# Patient Record
Sex: Male | Born: 1990
Health system: Southern US, Community
[De-identification: ages and names within clinical notes are randomized; demographics above are authoritative.]

## PROBLEM LIST (undated history)

## (undated) DIAGNOSIS — F1910 Other psychoactive substance abuse, uncomplicated: Secondary | ICD-10-CM

## (undated) DIAGNOSIS — J45909 Unspecified asthma, uncomplicated: Secondary | ICD-10-CM

## (undated) DIAGNOSIS — F419 Anxiety disorder, unspecified: Secondary | ICD-10-CM

## (undated) DIAGNOSIS — F909 Attention-deficit hyperactivity disorder, unspecified type: Secondary | ICD-10-CM

## (undated) DIAGNOSIS — A539 Syphilis, unspecified: Secondary | ICD-10-CM

## (undated) HISTORY — DX: Anxiety disorder, unspecified: F41.9

## (undated) HISTORY — DX: Syphilis, unspecified: A53.9

---
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
## 2016-04-09 ENCOUNTER — Ambulatory Visit (INDEPENDENT_AMBULATORY_CARE_PROVIDER_SITE_OTHER): Payer: 59 | Admitting: Psychology

## 2016-04-09 DIAGNOSIS — F902 Attention-deficit hyperactivity disorder, combined type: Secondary | ICD-10-CM

## 2016-04-09 DIAGNOSIS — F840 Autistic disorder: Secondary | ICD-10-CM

## 2016-05-06 ENCOUNTER — Other Ambulatory Visit: Payer: Self-pay | Admitting: Allergy and Immunology

## 2016-05-21 ENCOUNTER — Ambulatory Visit (INDEPENDENT_AMBULATORY_CARE_PROVIDER_SITE_OTHER): Payer: 59 | Admitting: Psychology

## 2016-05-21 DIAGNOSIS — F900 Attention-deficit hyperactivity disorder, predominantly inattentive type: Secondary | ICD-10-CM | POA: Diagnosis not present

## 2016-05-21 DIAGNOSIS — F840 Autistic disorder: Secondary | ICD-10-CM

## 2016-05-28 ENCOUNTER — Ambulatory Visit (INDEPENDENT_AMBULATORY_CARE_PROVIDER_SITE_OTHER): Payer: 59 | Admitting: Psychology

## 2016-05-28 DIAGNOSIS — F341 Dysthymic disorder: Secondary | ICD-10-CM

## 2016-05-28 DIAGNOSIS — F840 Autistic disorder: Secondary | ICD-10-CM

## 2016-05-28 DIAGNOSIS — F902 Attention-deficit hyperactivity disorder, combined type: Secondary | ICD-10-CM

## 2016-05-29 ENCOUNTER — Encounter: Payer: Self-pay | Admitting: *Deleted

## 2016-05-29 ENCOUNTER — Other Ambulatory Visit: Payer: Self-pay | Admitting: *Deleted

## 2016-05-29 NOTE — Progress Notes (Signed)
This encounter was created in error - please disregard.

## 2016-05-30 ENCOUNTER — Other Ambulatory Visit: Payer: Self-pay | Admitting: *Deleted

## 2016-05-30 NOTE — Telephone Encounter (Signed)
Refill request received from [facility] [phone] faxed back to pharmacy needs office visit last ov 02/09/2014

## 2016-06-06 ENCOUNTER — Ambulatory Visit (INDEPENDENT_AMBULATORY_CARE_PROVIDER_SITE_OTHER): Payer: 59 | Admitting: Psychology

## 2016-06-06 DIAGNOSIS — F902 Attention-deficit hyperactivity disorder, combined type: Secondary | ICD-10-CM | POA: Diagnosis not present

## 2016-06-06 DIAGNOSIS — F840 Autistic disorder: Secondary | ICD-10-CM | POA: Diagnosis not present

## 2016-06-20 ENCOUNTER — Ambulatory Visit (INDEPENDENT_AMBULATORY_CARE_PROVIDER_SITE_OTHER): Payer: 59 | Admitting: Psychology

## 2016-06-20 DIAGNOSIS — F902 Attention-deficit hyperactivity disorder, combined type: Secondary | ICD-10-CM | POA: Diagnosis not present

## 2016-06-20 DIAGNOSIS — F840 Autistic disorder: Secondary | ICD-10-CM | POA: Diagnosis not present

## 2016-07-04 ENCOUNTER — Ambulatory Visit (INDEPENDENT_AMBULATORY_CARE_PROVIDER_SITE_OTHER): Payer: 59 | Admitting: Psychology

## 2016-07-04 DIAGNOSIS — F840 Autistic disorder: Secondary | ICD-10-CM

## 2016-07-04 DIAGNOSIS — F902 Attention-deficit hyperactivity disorder, combined type: Secondary | ICD-10-CM | POA: Diagnosis not present

## 2016-07-24 ENCOUNTER — Ambulatory Visit (INDEPENDENT_AMBULATORY_CARE_PROVIDER_SITE_OTHER): Payer: 59 | Admitting: Psychology

## 2016-07-24 DIAGNOSIS — F840 Autistic disorder: Secondary | ICD-10-CM

## 2016-07-24 DIAGNOSIS — F902 Attention-deficit hyperactivity disorder, combined type: Secondary | ICD-10-CM

## 2016-08-15 ENCOUNTER — Ambulatory Visit (INDEPENDENT_AMBULATORY_CARE_PROVIDER_SITE_OTHER): Payer: 59 | Admitting: Psychology

## 2016-08-15 DIAGNOSIS — F1020 Alcohol dependence, uncomplicated: Secondary | ICD-10-CM | POA: Diagnosis not present

## 2016-08-15 DIAGNOSIS — F332 Major depressive disorder, recurrent severe without psychotic features: Secondary | ICD-10-CM | POA: Diagnosis not present

## 2016-08-15 DIAGNOSIS — F840 Autistic disorder: Secondary | ICD-10-CM

## 2016-08-20 ENCOUNTER — Other Ambulatory Visit (HOSPITAL_COMMUNITY): Payer: 59 | Attending: Psychiatry | Admitting: Licensed Clinical Social Worker

## 2016-08-20 ENCOUNTER — Encounter (INDEPENDENT_AMBULATORY_CARE_PROVIDER_SITE_OTHER): Payer: Self-pay

## 2016-08-20 DIAGNOSIS — F19982 Other psychoactive substance use, unspecified with psychoactive substance-induced sleep disorder: Secondary | ICD-10-CM | POA: Diagnosis not present

## 2016-08-20 DIAGNOSIS — G479 Sleep disorder, unspecified: Secondary | ICD-10-CM | POA: Diagnosis not present

## 2016-08-20 DIAGNOSIS — F1523 Other stimulant dependence with withdrawal: Secondary | ICD-10-CM | POA: Insufficient documentation

## 2016-08-20 DIAGNOSIS — F840 Autistic disorder: Secondary | ICD-10-CM | POA: Diagnosis not present

## 2016-08-20 DIAGNOSIS — F329 Major depressive disorder, single episode, unspecified: Secondary | ICD-10-CM | POA: Diagnosis not present

## 2016-08-20 DIAGNOSIS — F17210 Nicotine dependence, cigarettes, uncomplicated: Secondary | ICD-10-CM | POA: Diagnosis not present

## 2016-08-20 DIAGNOSIS — F1510 Other stimulant abuse, uncomplicated: Secondary | ICD-10-CM | POA: Diagnosis not present

## 2016-08-20 DIAGNOSIS — F1410 Cocaine abuse, uncomplicated: Secondary | ICD-10-CM | POA: Diagnosis present

## 2016-08-20 DIAGNOSIS — F1020 Alcohol dependence, uncomplicated: Secondary | ICD-10-CM | POA: Diagnosis present

## 2016-08-20 DIAGNOSIS — F1210 Cannabis abuse, uncomplicated: Secondary | ICD-10-CM | POA: Insufficient documentation

## 2016-08-20 DIAGNOSIS — F439 Reaction to severe stress, unspecified: Secondary | ICD-10-CM | POA: Insufficient documentation

## 2016-08-20 DIAGNOSIS — F902 Attention-deficit hyperactivity disorder, combined type: Secondary | ICD-10-CM | POA: Insufficient documentation

## 2016-08-20 DIAGNOSIS — F341 Dysthymic disorder: Secondary | ICD-10-CM | POA: Diagnosis not present

## 2016-08-20 DIAGNOSIS — G4700 Insomnia, unspecified: Secondary | ICD-10-CM | POA: Insufficient documentation

## 2016-08-20 NOTE — Psych (Signed)
Comprehensive Clinical Assessment (CCA) Note  08/20/2016 Edward Griffith [id number]  Visit Diagnosis:      ICD-9-CM ICD-10-CM   1. Alcohol use disorder, severe, dependence (HCC) 303.90 F10.20   2. Cocaine abuse 305.60 F14.10   3. Stimulant abuse 305.90 F15.10       CCA Part One  Part One has been completed on paper by the patient.  (See scanned document in Chart Review)  CCA Part Two A  Intake/Chief Complaint:  CCA Intake With Chief Complaint CCA Part Two Date: 08/20/16 CCA Part Two Time: 1414 Chief Complaint/Presenting Problem: Pt presents with his mother requesting treatment for substance use. Pt reports he has been drinking heavily as well as snorting cocaine and abusing his Adderall prescription. Pt states he has not had alcohol or drugs in 3 days. Pt reports compounding depression and it is unclear if the depression symptoms began before or after his use began. Pt reports fleeting SI and denies plan or intent.  Patients Currently Reported Symptoms/Problems: Pt reports experiencing withdrawal symptoms of nausea, fatigue, tremors, sweating, and hyper-somnia. Pt is not visibly affected. Pt's vitals were taken by CMA and were within normal range. Pt reports feelings of shame and disappointment due to lying and stealing from his support system due to his use. Pt reports depressed mood, lethargy, and anhedonia.  Collateral Involvement: Pt's mother, Arline AspCindy, reports pt has history of lying and stealing. Pt's mother states pt has lied about the last 3-4 jobs he has had; stating he had jobs, fabricating stories from the day - and then having never worked there. Pt's mother states he has stolen checks from her multiple times. Pt's mother states pt was previously diagnosed on the autism spectrum.  Individual's Strengths: Pt reports desire to stop using and has some awareness.   Mental Health Symptoms Depression:  Depression: Worthlessness  Mania:     Anxiety:      Psychosis:     Trauma:      Obsessions:     Compulsions:     Inattention:     Hyperactivity/Impulsivity:     Oppositional/Defiant Behaviors:     Borderline Personality:     Other Mood/Personality Symptoms:      Mental Status Exam Appearance and self-care  Stature:  Stature: Average  Weight:  Weight: Average weight  Clothing:  Clothing: Casual  Grooming:  Grooming: Well-groomed  Cosmetic use:  Cosmetic Use: None  Posture/gait:  Posture/Gait: Normal  Motor activity:  Motor Activity: Not Remarkable  Sensorium  Attention:  Attention: Normal  Concentration:  Concentration: Normal  Orientation:  Orientation: X5  Recall/memory:  Recall/Memory: Normal  Affect and Mood  Affect:  Affect: Appropriate  Mood:  Mood: Depressed  Relating  Eye contact:  Eye Contact: Fleeting  Facial expression:  Facial Expression: Responsive  Attitude toward examiner:  Attitude Toward Examiner: Cooperative  Thought and Language  Speech flow: Speech Flow: Normal  Thought content:  Thought Content: Appropriate to mood and circumstances  Preoccupation:     Hallucinations:     Organization:     Company secretaryxecutive Functions  Fund of Knowledge:  Fund of Knowledge: Average  Intelligence:  Intelligence: Average  Abstraction:  Abstraction: Normal  Judgement:  Judgement: Fair  Dance movement psychotherapisteality Testing:  Reality Testing: Adequate  Insight:  Insight: Flashes of insight  Decision Making:  Decision Making: Impulsive  Social Functioning  Social Maturity:     Social Judgement:     Stress  Stressors:  Stressors: Family conflict, Money, Transitions, Housing  Coping Ability:  Coping Ability: Exhausted  Skill Deficits:     Supports:      Family and Psychosocial History: Family history Marital status: Single  Childhood History:  Childhood History By whom was/is the patient raised?: Both parents Patient's description of current relationship with people who raised him/her: Pt reports conflictual relationship with his parents however states this is mostly  due to his use.  Does patient have siblings?: Yes Did patient suffer any verbal/emotional/physical/sexual abuse as a child?: No Did patient suffer from severe childhood neglect?: No Has patient ever been sexually abused/assaulted/raped as an adolescent or adult?: No Was the patient ever a victim of a crime or a disaster?: No Witnessed domestic violence?: No Has patient been effected by domestic violence as an adult?: No  CCA Part Two B  Employment/Work Situation: Employment / Work Psychologist, occupational Employment situation: Unemployed Has patient ever been in the [employer]?: No Are There Guns or Education officer, community in Your Home?: No  Education: Education Did Garment/textile technologist From [employer]?: Yes Did Theme park manager?: No  Religion: Religion/Spirituality Are You A Religious Person?: No  Leisure/Recreation: Leisure / Recreation Leisure and Hobbies: Pt reports "movies and videogames"  Exercise/Diet: Exercise/Diet Do You Have Any Trouble Sleeping?: Yes Explanation of Sleeping Difficulties: Pt states he has restless sleep  CCA Part Two C  Alcohol/Drug Use: Alcohol / Drug Use Pain Medications: Pt denies Prescriptions: Aderall XR 30 mg, PRN Over the Counter: Pt denies History of alcohol / drug use?: Yes Negative Consequences of Use: Financial, Personal relationships, Work / Programmer, multimedia Withdrawal Symptoms: Sweats, Irritability, Tremors, Nausea / Vomiting Substance #1 Name of Substance 1: Alcohol 1 - Age of First Use: UKN 1 - Amount (size/oz): 3-4, 40 oz drinks daily 1 - Frequency: daily; increase on weekends 1 - Duration: 4 years 1 - Last Use / Amount: 08/17/16; pt reports he does not remember how much Substance #2 Name of Substance 2: Cocaine 2 - Age of First Use: UKN 2 - Amount (size/oz): "a few lines" 2 - Frequency: on the weekends 2 - Duration: 1.5 - 2 years 2 - Last Use / Amount: 08/16/16 Substance #3 Name of Substance 3: Adderall (Pt reports absuing his prescription) 3 - Age of  First Use: UKN 3 - Amount (size/oz): 90-120 mg 3 - Frequency: 2-3x/week 3 - Duration: UKN 3 - Last Use / Amount: 08/15/16     CCA Part Three  ASAM's:  Six Dimensions of Multidimensional Assessment  Dimension 1:  Acute Intoxication and/or Withdrawal Potential:     Dimension 2:  Biomedical Conditions and Complications:     Dimension 3:  Emotional, Behavioral, or Cognitive Conditions and Complications:     Dimension 4:  Readiness to Change:     Dimension 5:  Relapse, Continued use, or Continued Problem Potential:     Dimension 6:  Recovery/Living Environment:      Substance use Disorder (SUD) Substance Use Disorder (SUD)  Checklist Symptoms of Substance Use: Evidence of tolerance, Continued use despite persistent or recurrent social, interpersonal problems, caused or exacerbated by use, Evidence of withdrawal (Comment), Large amounts of time spent to obtain, use or recover from the substance(s), Presence of craving or strong urge to use, Social, occupational, recreational activities given up or reduced due to use, Recurrent use that results in a fialure to fulfill major rule obligatinos (work, school, home)  Social Function:     Stress:  Stress Stressors: Family conflict, Arts administrator, Transitions, Housing Coping Ability: Exhausted Patient Takes Medications The Way The Doctor Instructed?:  No Priority Risk: Moderate Risk  Risk Assessment- Self-Harm Potential: Risk Assessment For Self-Harm Potential Thoughts of Self-Harm: No current thoughts Method: No plan  Risk Assessment -Dangerous to Others Potential: Risk Assessment For Dangerous to Others Potential Method: No Plan  DSM5 Diagnoses: There are no active problems to display for this patient.   Recommendations for Services/Supports/Treatments: Recommendations for Services/Supports/Treatments Recommendations For Services/Supports/Treatments: CD-IOP Intensive Chemical Dependency Program (Pt will benefit from CD-IOP due to extensive  use history and recent ceasing of use.  Pt will need support during early recovery)  Treatment Plan Summary: OP Treatment Plan Summary: Pt states: "I need get off this stuff. I don't need it in my life."   Referrals to Alternative Service(s): Referred to Alternative Service(s):   Place:   Date:   Time:    Referred to Alternative Service(s):   Place:   Date:   Time:    Referred to Alternative Service(s):   Place:   Date:   Time:    Referred to Alternative Service(s):   Place:   Date:   Time:     Donia GuilesJenny Imaya Duffy, MSW, LCSW, LCAS

## 2016-08-25 ENCOUNTER — Other Ambulatory Visit (HOSPITAL_COMMUNITY): Payer: 59 | Admitting: Psychology

## 2016-08-25 DIAGNOSIS — F1020 Alcohol dependence, uncomplicated: Secondary | ICD-10-CM | POA: Diagnosis not present

## 2016-08-26 ENCOUNTER — Other Ambulatory Visit (HOSPITAL_COMMUNITY): Payer: 59

## 2016-08-27 ENCOUNTER — Other Ambulatory Visit (HOSPITAL_COMMUNITY): Payer: 59 | Admitting: Psychology

## 2016-08-27 ENCOUNTER — Encounter (HOSPITAL_COMMUNITY): Payer: Self-pay | Admitting: Psychology

## 2016-08-27 DIAGNOSIS — F1020 Alcohol dependence, uncomplicated: Secondary | ICD-10-CM

## 2016-08-27 NOTE — Progress Notes (Signed)
    Daily Group Progress Note  Program: CD-IOP   08/27/2016 Edward Griffith [id number]  Diagnosis:  No diagnosis found.   Sobriety Date: 11/17  Group Time: 1-23:30 pm  Participation Level: Active  Behavioral Response: Sharing  Type of Therapy: Process Group  Interventions: Supportive  Topic: Process: Counselors met with patients to discuss recovery-related activities they had engaged in to support their sobriety. Two group members shared that they had returned to use. A new patient introduced himself to the group and shared reasons for pursuing treatment. All members were active and engaged in the discussion concerning recovery from mind-altering drugs and alcohol.  Group Time: 2:30-4 pm  Participation Level: Minimal  Behavioral Response: Sharing  Type of Therapy: Psycho-education Group  Interventions: Solution Focused  Topic: Psychoeducation: Counselors facilitated a discussion on relapse-prevention techniques. Group members filled out and discussed a checklist of situations they had encountered during their recovery. Information was provided regarding 12-step meetings during the holiday season. The counselors asked patients to share holiday plans as they related to recovery. A new group member met with the program director.   Summary: The patient presented as moderately active and engaged in group. This was the patient's first group. He shared that he had been "drinking, lying, and stealing" and that it had hurt his relationship with others around him - particularly his parents. He shared that he was unsure of the events from the previous week and therefore was unable to directly pinpoint when he last used. He admitted that his parents are the driving force behind him being here. He seemed somewhat 'out of it' and his participation in the psycho-ed was minimal.   UDS collected: No Results:   AA/NA attended?: No  Sponsor?: No   Ann Evans, LCAS 08/27/2016 6:19 PM 

## 2016-08-29 ENCOUNTER — Other Ambulatory Visit (HOSPITAL_COMMUNITY): Payer: 59

## 2016-08-31 ENCOUNTER — Encounter (HOSPITAL_COMMUNITY): Payer: Self-pay | Admitting: Psychology

## 2016-08-31 NOTE — Progress Notes (Signed)
    Daily Group Progress Note  Program: CD-IOP    ADAMS HINCH [id number]  Diagnosis: F10.20  Sobriety Date: 11/17  Group Time: 1-2:30 pm  Participation Level: Minimal  Behavioral Response: Resistant and inattentive  Type of Therapy: Process Group  Interventions: Supportive  Topic: Process: The first half of group was spent in process. Group members described the things they had done since we last met to support their sobriety. They were also encouraged to share any thoughts of using, cravings or challenges to their recovery. The program director met with 2 group members and drug tests were collected from every member except for the member who was graduating today. During their check-ins, members shared briefly about their plans over the upcoming holiday weekend.   Group Time: 2:30-4 pm  Participation Level: Minimal  Behavioral Response: disengaged  Type of Therapy: Psycho-education Group  Interventions: Strength-based  Topic: Psycho-Ed: Relapse Prevention, Part 2/Graduation: the second half of group was spent in a psycho-ed on relapse prevention. It was a continuation of the psycho-ed on Monday. Members reviewed the handout provided during the last session and a discussion ensued on challenging thoughts of using in early recovery. Members shared openly about some of their ambivalence, which is to be expected. During the last 20 minutes of group today a graduation ceremony was held to honor a member who was completing the program. His wife had arrived and joined the celebration as did a member who had graduated last week. Kind words and brownies were shared as the session ended.   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 He explained that this is what he does for income. He reported he had gotten a phone call this morning stating that his ex-fiance was in ICU at [facility]  and unresponsive. The patient wasn't sure what had happened other than she had gone with friends out to party last night. This is all he knew. When asked about meetings, the patient reported that he hadn't gone to any yet. He reminded the group that he doesn't drive, "I have a license but not a car" and his aunt is staying at the house and transporting him around. His parents have gone on a 2 week vacation and got his aunt to chaperone their son for the time being. When asked for feedback about another member's disclosure, the patient shook his head and admitted he wasn't paying attention. He had said pretty much the same thing in the last group session and it seems obvious this young man is not very attentive or even interested in what we are discussing here in the sessions. This counselor reminded the patient that attending 12-step meetings was required and he would have to report on this when we meet again on Monday.    UDS collected: Yes Results: pending  AA/NA attended?: No  Sponsor?: No   Brandon Melnick, LCAS 08/31/2016 11:14 AM

## 2016-09-01 ENCOUNTER — Other Ambulatory Visit (HOSPITAL_COMMUNITY): Payer: 59 | Admitting: Psychology

## 2016-09-01 DIAGNOSIS — F1020 Alcohol dependence, uncomplicated: Secondary | ICD-10-CM

## 2016-09-02 ENCOUNTER — Other Ambulatory Visit (HOSPITAL_COMMUNITY): Payer: 59

## 2016-09-02 NOTE — Progress Notes (Signed)
    Daily Group Progress Note  Program: CD-IOP   09/02/2016 Edward Griffith [id number]  Diagnosis:  Alcohol use disorder, severe, dependence ([place])   Sobriety Date: 08/22/16  Group Time: 1-2:30  Participation Level: Active  Behavioral Response: Appropriate, Sharing and Minimizing  Type of Therapy: Process Group  Interventions: CBT, Motivational Interviewing and Solution Focused  Topic: Counselors met with patients for 1.5 hour group processing session. Patients were active and engaged and discussed the recent holiday break, challenges to their recovery process, and family interactions. UDS were collected from all patients present. 2 group members shared about lapsing over the break. One patient was absent with no known attempt to communicate. Another group member was absent and called to say she was sick at home. Some patients met with program director to discuss MAT.     Group Time: 2:30-4  Participation Level: Active  Behavioral Response: Appropriate and Sharing  Type of Therapy: Psycho-education Group  Interventions: CBT, Motivational Interviewing and Solution Focused  Topic: Counselors met with patients for 1.5 hour group psychoeducation session. Counselor led topical discussion on "Adult Children of Alcoholics". Patients identified characteristics of healthy and unhealthy families. Counselor also led a 5 minute guided imagery meditation about "Coping with Change". Patient processed their response to activity and meditation.   Summary: Patient was moderately active and engaged in group session. He presented as distracted and minimizing of his problematic behavior. Patient stated that he attended 2 AA meetings since our last group but was unable to answer the location of one of the meetings. Patient shared that he spent the holiday with his brother and ended up sleeping around 20 hours. Patient stated this was not normal for him. Patient shared about his connection to a  local coffee chop that was "very supportive" of his mental health in previous attempts to "live clean". Patient reported he went to a bar over the break to play pool and resisted the temptation to drink alcohol. Counselor discussed alternatives for playing pool instead of at a bar. Patient appears ambivalent about his addiction to stimulants but serious about changing his drinking behavior. A UDS was collected from patient. Youlanda Roys, LPCA   UDS collected: Yes Results: Pending  AA/NA attended?: YesWednesday and Friday  Sponsor?: No   Brandon Melnick, LCAS 09/02/2016 3:26 PM

## 2016-09-03 ENCOUNTER — Other Ambulatory Visit (INDEPENDENT_AMBULATORY_CARE_PROVIDER_SITE_OTHER): Payer: 59 | Admitting: Psychology

## 2016-09-03 ENCOUNTER — Encounter (HOSPITAL_COMMUNITY): Payer: Self-pay | Admitting: Medical

## 2016-09-03 VITALS — BP 118/72 | HR 72 | Ht 69.0 in | Wt 143.0 lb

## 2016-09-03 DIAGNOSIS — Z811 Family history of alcohol abuse and dependence: Secondary | ICD-10-CM

## 2016-09-03 DIAGNOSIS — F902 Attention-deficit hyperactivity disorder, combined type: Secondary | ICD-10-CM

## 2016-09-03 DIAGNOSIS — F840 Autistic disorder: Secondary | ICD-10-CM | POA: Diagnosis not present

## 2016-09-03 DIAGNOSIS — F1520 Other stimulant dependence, uncomplicated: Secondary | ICD-10-CM

## 2016-09-03 DIAGNOSIS — F1210 Cannabis abuse, uncomplicated: Secondary | ICD-10-CM | POA: Insufficient documentation

## 2016-09-03 DIAGNOSIS — F909 Attention-deficit hyperactivity disorder, unspecified type: Secondary | ICD-10-CM | POA: Insufficient documentation

## 2016-09-03 DIAGNOSIS — F1523 Other stimulant dependence with withdrawal: Secondary | ICD-10-CM | POA: Diagnosis not present

## 2016-09-03 DIAGNOSIS — F1020 Alcohol dependence, uncomplicated: Secondary | ICD-10-CM | POA: Diagnosis not present

## 2016-09-03 DIAGNOSIS — F1593 Other stimulant use, unspecified with withdrawal: Secondary | ICD-10-CM

## 2016-09-03 DIAGNOSIS — Z6372 Alcoholism and drug addiction in family: Secondary | ICD-10-CM

## 2016-09-03 DIAGNOSIS — F19982 Other psychoactive substance use, unspecified with psychoactive substance-induced sleep disorder: Secondary | ICD-10-CM

## 2016-09-03 DIAGNOSIS — Z8659 Personal history of other mental and behavioral disorders: Secondary | ICD-10-CM

## 2016-09-03 DIAGNOSIS — F341 Dysthymic disorder: Secondary | ICD-10-CM

## 2016-09-03 MED ORDER — BUPROPION HCL ER (SR) 150 MG PO TB12: 150.0000 mg | ORAL_TABLET | Freq: Two times a day (BID) | ORAL | 2 refills | Status: DC

## 2016-09-03 NOTE — Progress Notes (Addendum)
Psychiatric Initial Adult Assessment   Patient Identification: Edward Griffith MRN:  [id number] Date of Evaluation:  09/03/2016 Referral Source: Thomas HoffJennie Edminson LCSW Chief Complaint:   Chief Complaint    Addiction Problem; Establish Care; Autism; ADHD; Trauma; Stress; Sleep disorder     Visit Diagnosis:    ICD-9-CM ICD-10-CM   1. Stimulant dependence (HCC) 304.40 F15.20    Amphetamine and Cocaine  2. Alcohol use disorder, severe, dependence (HCC) 303.90 F10.20   3. Stimulant withdrawal (HCC) 292.0 F15.23    304.40    4. Autism spectrum disorder 299.00 F84.0   5. Attention deficit hyperactivity disorder (ADHD), combined type 314.01 F90.2   6. Cannabis abuse, episodic use 305.22 F12.10   7. History of posttraumatic stress disorder (PTSD) V11.8 Z86.59   8. Dysthymia (or depressive neurosis) 300.4 F34.1   9. Drug induced insomnia (HCC) 292.85 F19.982    E980.5     Subjective "I have an addiction to alcohol and coke (snort)" History of Present Illness:   Associated Signs/Symptoms: DSM V SUD Criteria 9/11 + Severe dependence Alcohol and Stimulants(Amphetamine and Cocaine) Depression Symptoms:  depressed mood, anhedonia, insomnia, fatigue, feelings of worthlessness/guilt, difficulty concentrating, (Hypo) Manic Symptoms:  Hallucinations,drug related Anxiety Symptoms:  Agoraphobia,No Excessive Worry,No Panic Symptoms,None recently/Drug related Obsessive Compulsive Symptoms:   None,, Social Anxiety,No Specific Phobias,Buses;Heights Psychotic Symptoms:  Hallucinations: Auditory Visual drug withdrawal related PTSD Symptoms: Had a traumatic exposure:  Psychiatric [facility] [place] Traumatic infancy/early childhood fostered/multiple hospitalizations for illness age 25 mos ?in Utero exsposure to drugs/alcohol -Had a traumatic exposure in the last month:  NONE Re-experiencing:  Hospital Flashbacks Nightmares Hypervigilance:   Yes Hyperarousal:  Difficulty Concentrating Emotional Numbness/Detachment Irritability/Anger Sleep Avoidance:  Decreased Interest/Participation Foreshortened Future [facility]  Past Psychiatric History: Diagnosis: ADHD age 14;ASD age 25;  Hospitalizations:[place] 2015 [facility] reaction "chemical imbalance"   [facility]  Substance Abuse Care: ED visit 11/10 /17 Alcohol abuse/[facility] CDIOP now  Self-Mutilation: NA  Suicidal Attempts: NONE  Violent Behaviors: NONE    Previous Psychotropic Medications: Yes Multiple amphetamines-Adderall mainly/Straterra-?psychotic reaction  Substance Abuse History in the last 12 months:  Yes.   Substance Abuse History in the last 12 months: Substance Age of 1st Use Last Use Amount Specific Type  Nicotine 15 today 1 PPD Cigs  Alcohol 15 08/17/2016  Reports drinking Approximately 4, 40 ounce beers during the weekdays, and will consume 5-6 shots and up to 12 beers on the weekends       Cannabis 15 08/15/2016 2-4x/week smoke  Opiates      Cocaine 19 08/15/2016 2-3x/wk snort  Methamphetamines 25 y/o ADHD 30 mg QD 08/15/2016 90-120mg  2-3x/wk Snort/mix with cocaine  LSD Tried "a couple of times didnt like    Ecstasy      Benzodiazepines      Caffeine      Inhalants      Others:                          Consequences of Substance Abuse: Withdrawal Symptoms:   Headaches dysphoria  Past Medical History: No past medical history on file. No past surgical history on file.  Family Psychiatric History:Birth parents Mother Schizophrenic Father Alcoholic   Family History: Diabetes runs in family  Social History:   Social History   Social History  . Marital status: Single    Spouse name: N/A  .  Number of children: N/A  . Years of education: Associates degree [employer]-credits-no degree   Social History Main Topics  . Smoking status: Current Every Day Smoker     Packs/day: 1.00    Types: Cigarettes age 25  . Smokeless tobacco: None  . Alcohol use on file  . Drug use: See SA chart  . Sexual activity: Bi   Other Topics Concern  . Not on file   Social History Narrative  . No narrative on file    Additional Social History:   Allergies:  Not on File  Metabolic Disorder Labs: No results found for: HGBA1C, MPG No results found for: PROLACTIN No results found for: CHOL, TRIG, HDL, CHOLHDL, VLDL, LDLCALC   Current Medications: Current Outpatient Prescriptions  Medication Sig Dispense Refill  . amphetamine-dextroamphetamine (ADDERALL XR) 30 MG 24 hr capsule     . amphetamine-dextroamphetamine (ADDERALL) 30 MG tablet     . EPINEPHrine 0.3 mg/0.3 mL IJ SOAJ injection      No current facility-administered medications for this visit.     Neurologic: Headache: Yes Seizure: Negative Paresthesias:Negative  Musculoskeletal: Strength & Muscle Tone: within normal limits Gait & Station: normal Patient leans: N/A  Psychiatric Specialty Exam: Review of Systems  Constitutional: Positive for malaise/fatigue and weight loss. Negative for chills, diaphoresis and fever.  HENT: Negative for congestion, ear discharge, ear pain, hearing loss, nosebleeds, sinus pain, sore throat and tinnitus.   Eyes: Negative for blurred vision, double vision, photophobia, pain, discharge and redness.  Respiratory: Negative for cough, hemoptysis, sputum production, shortness of breath, wheezing and stridor.   Cardiovascular: Negative for chest pain, palpitations, orthopnea, claudication, leg swelling and PND.  Gastrointestinal: Negative for abdominal pain, blood in stool, constipation, diarrhea, heartburn, nausea and vomiting.  Genitourinary: Negative for dysuria, flank pain, frequency, hematuria and urgency.  Musculoskeletal: Negative for back pain, falls, joint pain, myalgias and neck pain.  Skin: Negative for itching and rash.  Neurological: Positive for headaches  (withdrawal rekated). Negative for dizziness, tingling, tremors, sensory change, speech change, focal weakness, seizures, loss of consciousness and weakness.  Endo/Heme/Allergies: Negative for environmental allergies and polydipsia. Does not bruise/bleed easily.  Psychiatric/Behavioral: Positive for depression (Mild PHQ 9 score 9 not difficult) and hallucinations (drug related in past). Negative for memory loss, substance abuse and suicidal ideas. The patient has insomnia. The patient is not nervous/anxious (GAD 7 negative).        ASD/ADHD    Blood pressure 118/72, pulse 72, height 5\' 9"  (1.753 m), weight 143 lb (64.9 kg).Body mass index is 21.12 kg/m.  General Appearance: Fairly Groomed  Eye Contact:  Fair  Speech:  Clear and Coherent and Occasional edir torial comments after anwering questions in lower tone and more rapid less clear dicxtion  Volume:  Variable  Mood:  Variable  Affect:  Congruent and Full Range  Thought Process:  Coherent and Descriptions of Associations: Intact  Orientation:  Full (Time, Place, and Person)  Thought Content:  WDL, Logical and Rumination  Suicidal Thoughts:  No  Homicidal Thoughts:  No  Memory:  Negative  Judgement:  Impaired  Insight:  Lacking  Psychomotor Activity:  Normal  Concentration:  Concentration: Good and Attention Span: Good  Recall:  Good  Fund of Knowledge:Fair  Language: Fair  Akathisia:  NA  Handed:  Right  AIMS (if indicated):  NA  Assets:  Desire for Improvement Financial Resources/Insurance Housing Resilience Social Support Transportation Vocational/Educational  ADL's:  Intact  Cognition: Impaired,  Moderate  Sleep:  Improving  Treatment Plan Summary: Treatment Plan/Recommendations:  Plan of Care: [facility] CDIOP/PTSD IOP TraumaCounseling/ASD-ADHD Wellbutrin SR trial 150 mg BID,Classroom modifications   Laboratory:  UDS  Psychotherapy: IOP individual;group and family  Medications: Wellbutrin SR ?Baclofen for  cravings-pt denies at present  Routine PRN Medications:  Negative  Consultations: None now  Safety Concerns:  Relapse  Other:  ADHD     Maryjean Morn, PA-C 11/29/20174:53 PM

## 2016-09-04 ENCOUNTER — Other Ambulatory Visit (HOSPITAL_COMMUNITY): Payer: 59 | Admitting: Psychology

## 2016-09-04 DIAGNOSIS — F1020 Alcohol dependence, uncomplicated: Secondary | ICD-10-CM | POA: Diagnosis not present

## 2016-09-04 DIAGNOSIS — F1520 Other stimulant dependence, uncomplicated: Secondary | ICD-10-CM

## 2016-09-04 DIAGNOSIS — F902 Attention-deficit hyperactivity disorder, combined type: Secondary | ICD-10-CM

## 2016-09-04 DIAGNOSIS — F1523 Other stimulant dependence with withdrawal: Secondary | ICD-10-CM

## 2016-09-04 DIAGNOSIS — F840 Autistic disorder: Secondary | ICD-10-CM

## 2016-09-04 DIAGNOSIS — F1593 Other stimulant use, unspecified with withdrawal: Secondary | ICD-10-CM

## 2016-09-05 ENCOUNTER — Other Ambulatory Visit (HOSPITAL_COMMUNITY): Payer: 59

## 2016-09-05 NOTE — Progress Notes (Signed)
    Daily Group Progress Note  Program: CD-IOP   09/05/2016 Edward Griffith [id number]  Diagnosis:  Stimulant dependence ([place]) - Amphetamine and Cocaine  Alcohol use disorder, severe, dependence ([place])  Stimulant withdrawal ([place])  Autism spectrum disorder  Attention deficit hyperactivity disorder (ADHD), combined type  Cannabis abuse, episodic use  History of posttraumatic stress disorder (PTSD)  Dysthymia (or depressive neurosis)  Drug induced insomnia ([place])   Sobriety Date: 08/22/16  Group Time: 1-2:30  Participation Level: Active  Behavioral Response: Appropriate and Sharing  Type of Therapy: Process Group  Interventions: Motivational Interviewing  Topic: Counselors met with patients to discuss the activities they had engaged in to support their recovery since the previous session. A new group member introduced himself to the group and shared his reasons for pursuing treatment. Most members were active and engaged in the discussion concerning recovery from mind-altering drugs and alcohol.      Group Time: 2:30-4  Participation Level: Active  Behavioral Response: Appropriate and Sharing  Type of Therapy: Psycho-education Group  Interventions: Strength-based and Supportive  Topic:Counselors continued the discussion from the previous session about characteristics of Adult Children of Alcoholics. The group members finished reading though the laundry list and were asked to identify which characteristics they personally identified with. The counselors then helped the patients create personal measurable goals for their treatment based on the characteristic they identified. Two group members met with the Investment banker, operational. A drug test was collected from one group member. PHQ-9 and GAD assessments were collected from all group members.     Summary: The patient presented as active and engaged in group. He shared more than he had in previous sessions. 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 He shared that while he was growing up his parents fostered "over 70" kids which led to limited time and "no rules." He shared more of his dynamic with his parents and how it contributed to his use. He reports his use escalating following the death of his grandmother - he reports spending much of his time with her. Over the holiday he had been able to share information about his use to his brother. The patient met with the program director as part of the CD-IOP intake process. PHQ-9: 5 GAD: 2. The patient was unable to attend any meetings.  Youlanda Roys, LPCA   UDS collected: No Results: negative  AA/NA attended?: No  Sponsor?: No   Brandon Melnick, LCAS 09/05/2016 1:18 PM

## 2016-09-05 NOTE — Progress Notes (Signed)
    Daily Group Progress Note  Program: CD-IOP   09/05/2016 Edward Griffith [id number]  Diagnosis:  Stimulant dependence ([place])  Alcohol use disorder, severe, dependence ([place])  Stimulant withdrawal ([place])  Autism spectrum disorder  Attention deficit hyperactivity disorder (ADHD), combined type   Sobriety Date: 08/22/16  Group Time: 1-2:30    Participation Level: Minimal  Behavioral Response: Sharing  Type of Therapy: Process Group  Interventions: Motivational Interviewing  Topic: Counselor met with patients for 1.5 hour group process therapy session. Patients were active, engaged, and talked lively. Patients spoke on their recovery and sobriety from mind-altering drugs and alcohol. One UDS was collected from patient.      Group Time: 2:30-4  Participation Level: Minimal  Behavioral Response: Drowsy  Type of Therapy: Psycho-education Group  Interventions: Meditation: MBRP  Topic: Counselor met with patients for 1.5 hour group psychoeducation therapy session. Counselor led discussing on and experience of mindfulness based relapse prevention (MBRP) for using meditation during recovery from mind-altering drugs and alcohol. Patients were moderately interested and presented as tired and somewhat disengaged.   Summary: Patient presented for CD-IOP and was moderately engaged during session. He was able to verbalize some recovery related activities that he had engaged in since last group including baking and furniture restoration. Patient stated he felt weird last night since his aunt left him alone at his house until his parents arrived back from a trip at [place]. Patient stated he did not use drugs or alcohol and did not have any temptations or thoughts to use any. Patient stated he wants to bake desserts for the group and seemed to be seeking acceptance from other group members. Patient was sleepy during meditation and appeared to be resting. Patient was not able to verbalize  his experience of the meditation and seemed mildly interested in the topic. Counselor encouraged patient to find more AA/NA meetings to attend this weekend. Patient discussed meetings with other group members. Youlanda Roys, LPCA   UDS collected: No Results: negative  AA/NA attended?: No  Sponsor?: No   Brandon Melnick, LCAS 09/05/2016 12:53 PM

## 2016-09-08 ENCOUNTER — Other Ambulatory Visit (HOSPITAL_COMMUNITY): Payer: 59 | Attending: Psychiatry | Admitting: Psychology

## 2016-09-08 DIAGNOSIS — F1210 Cannabis abuse, uncomplicated: Secondary | ICD-10-CM | POA: Insufficient documentation

## 2016-09-08 DIAGNOSIS — F4310 Post-traumatic stress disorder, unspecified: Secondary | ICD-10-CM | POA: Diagnosis not present

## 2016-09-08 DIAGNOSIS — Z6372 Alcoholism and drug addiction in family: Secondary | ICD-10-CM

## 2016-09-08 DIAGNOSIS — F1520 Other stimulant dependence, uncomplicated: Secondary | ICD-10-CM | POA: Diagnosis not present

## 2016-09-08 DIAGNOSIS — F902 Attention-deficit hyperactivity disorder, combined type: Secondary | ICD-10-CM | POA: Insufficient documentation

## 2016-09-08 DIAGNOSIS — F840 Autistic disorder: Secondary | ICD-10-CM | POA: Insufficient documentation

## 2016-09-08 DIAGNOSIS — F1020 Alcohol dependence, uncomplicated: Secondary | ICD-10-CM | POA: Insufficient documentation

## 2016-09-08 DIAGNOSIS — F1410 Cocaine abuse, uncomplicated: Secondary | ICD-10-CM | POA: Diagnosis present

## 2016-09-08 DIAGNOSIS — Z811 Family history of alcohol abuse and dependence: Secondary | ICD-10-CM | POA: Insufficient documentation

## 2016-09-08 NOTE — Addendum Note (Signed)
Addended by: Court JoyKOBER, Griffin Dewilde E on: 09/08/2016 01:59 PM   Modules accepted: Orders

## 2016-09-09 ENCOUNTER — Other Ambulatory Visit (HOSPITAL_COMMUNITY): Payer: 59

## 2016-09-10 ENCOUNTER — Encounter (HOSPITAL_COMMUNITY): Payer: Self-pay | Admitting: Psychology

## 2016-09-10 ENCOUNTER — Other Ambulatory Visit (HOSPITAL_COMMUNITY): Payer: 59 | Admitting: Psychology

## 2016-09-10 ENCOUNTER — Ambulatory Visit (INDEPENDENT_AMBULATORY_CARE_PROVIDER_SITE_OTHER): Payer: 59 | Admitting: Psychology

## 2016-09-10 DIAGNOSIS — F840 Autistic disorder: Secondary | ICD-10-CM

## 2016-09-10 DIAGNOSIS — F1520 Other stimulant dependence, uncomplicated: Secondary | ICD-10-CM

## 2016-09-10 DIAGNOSIS — F1020 Alcohol dependence, uncomplicated: Secondary | ICD-10-CM | POA: Diagnosis not present

## 2016-09-10 DIAGNOSIS — F902 Attention-deficit hyperactivity disorder, combined type: Secondary | ICD-10-CM | POA: Diagnosis not present

## 2016-09-10 NOTE — Progress Notes (Signed)
    Daily Group Progress Note  Program: CD-IOP   09/10/2016 Edward Griffith [id number]  Diagnosis:  No diagnosis found.   Sobriety Date: 11/12  Group Time: 1-2:30 pm  Participation Level: Minimal  Behavioral Response: Sharing  Type of Therapy: Process Group  Interventions: Supportive  Topic: Process: The first half f group was spent in process. Members shared about the past weekend and any struggles or challenges they had faced in early recovery. They also identified the various ways they had supported and strengthened their recovery. During group today, three group members met with the program director, including the newest member. Four drug tests were collected today.   Group Time: 2:30-4 pm  Participation Level: Minimal  Behavioral Response: Resistant  Type of Therapy: Psycho-education Group  Interventions: Solution Focused  Topic: Psycho-Ed: The Wheel of Life. The second half of group was spent in a psycho-ed. A handout was provided that included 'Assessing Your Life Balance". Members colored in the six different sections and identified how satisfied they were in each category. These categories included Physical, financial, intellectual, emotional, social and spiritual. Members were active and engaged in sharing their findings, including identifying where they felt strongest and in what area they might want to improve upon.  Summary: The patient reported he had attended two [place] meetings. He had seen some fellow group members in those [place] meetings, so they could verify his attendance. The patient reported that he had spent time with his father, which had been enjoyable, and he was pleased because his aunt had been validating his efforts to his parents. Because of his aunt' disclosures, his parents have agreed that he can attend meetings by himself. The patient's disclosure came about through probing by the counselor and he shares little of himself openly. He noted something  about his girlfriend and this counselor wondered what has happened since his first disclosure described her as his 'ex-fianc' who was 'unresponsive in ICU'. The medical director met with this patient briefly to review medications. In the psycho-ed, he identified the desire to address his intellectual element and his desire to become closer to his family. The patient remains distant and his engagement somewhat obscure. It remains to be seen whether he is appropriate or interested in recovery. He has opened a little in each session so perhaps we will get to the real authentic person he is.    UDS collected: Yes Results:pending  AA/NA attended?: YesThursday and Saturday  Sponsor?: No   Edward Griffith, LCAS 09/10/2016 7:44 AM

## 2016-09-11 ENCOUNTER — Other Ambulatory Visit (HOSPITAL_COMMUNITY): Payer: 59 | Admitting: Psychology

## 2016-09-11 DIAGNOSIS — F1520 Other stimulant dependence, uncomplicated: Secondary | ICD-10-CM

## 2016-09-11 DIAGNOSIS — Z8659 Personal history of other mental and behavioral disorders: Secondary | ICD-10-CM

## 2016-09-11 DIAGNOSIS — F902 Attention-deficit hyperactivity disorder, combined type: Secondary | ICD-10-CM

## 2016-09-11 DIAGNOSIS — F1020 Alcohol dependence, uncomplicated: Secondary | ICD-10-CM

## 2016-09-11 DIAGNOSIS — F1210 Cannabis abuse, uncomplicated: Secondary | ICD-10-CM

## 2016-09-12 ENCOUNTER — Encounter (HOSPITAL_COMMUNITY): Payer: Self-pay | Admitting: Psychology

## 2016-09-12 ENCOUNTER — Other Ambulatory Visit (HOSPITAL_COMMUNITY): Payer: 59

## 2016-09-12 NOTE — Progress Notes (Signed)
    Daily Group Progress Note  Program: CD-IOP   09/12/2016 Edward Griffith [id number]  Diagnosis:  Alcohol use disorder, severe, dependence ([name])  Cannabis abuse, episodic use  Stimulant dependence ([name])  Attention deficit hyperactivity disorder (ADHD), combined type  History of posttraumatic stress disorder (PTSD)   Sobriety Date: 08/22/16  Group Time: 1-2:30  Participation Level: Active  Behavioral Response: Appropriate and Sharing  Type of Therapy: Process Group  Interventions: CBT and DBT  Topic: Counselors met with patients for 1.5 hour group process session. Patients were active and engaged in session. Some members were absent due to illness and alternative appointments. Counselor focused discussion on patients' goals in tx including sobriety and social support. Patients offered helpful feedback to each other and communicated clearly about their needs and hopes for group.      Group Time: 2:30-4  Participation Level: Active  Behavioral Response: Appropriate and Sharing  Type of Therapy: Psycho-education Group  Interventions: DBT and Social Skills Training  Topic: Counselors met with patients for 1.5 hour group process session to discuss "interpersonal effectiveness" from a DBT skills workbook. Additionally, patients practiced listening skills with each other in dyads. Counselor led discussion on listening activity and any lessons learned. Patients brainstormed a list of "good listening habits". Counselor spent final 20 min of group talking about recognizing addictive behaviors, thoughts, and feelings.   Summary: Patient was active and engaged in session. He presented with what appeared to be the same clothes he wore during the last 2 groups. Patient was smiling and minimizing during much of his check in and seems unable to express congruent feelings with group (smiling, chuckling during sad statements). He stated he attended 1 AA meeting since our last group  and was feeling better about NA and the support he was receiving at meetings. Patient shared about an earlier experience in the week in which he went to a bar and drank a nonalcoholic drink after a stressful exchange with his parents. Counselor helped patient express his full feelings and thoughts on the topic. Patient stated he did not share earlier because he was afraid the group would not believe he did not drink. Counselor encouraged patient to state his feelings and encounters honestly in group. 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 He stated he plans to attend 4 12 step meetings before Monday of next week. During psychoed session he was unable to remain completely quiet during listening exercise and interrupted others speaking due to "feeling nervous". Youlanda Roys, LPCA   UDS collected: No Results: negative  AA/NA attended?: YesWednesday  Sponsor?: No   Brandon Melnick, LCAS 09/12/2016 10:36 AM

## 2016-09-12 NOTE — Progress Notes (Signed)
Edward Griffith is a 25 y.o. male patient. CD-IOP INDIVIDUAL COUNSELING SESSION Patient met with counselor for 50 min individual session from 4-4:50pm. Patient was active and engaged during session. COunselor began session by inquiring about patient's experience the last few days concerning the return of his parents in the household and his attendance at 12 step meetings. Patient stated he was feeling overwhelmed by his parent's opposition to his relationship with his s/o. Patient admits that his ex-girlfriend and he have had interpersonal problems that could possibly distract from patient's recovery from mind-altering drugs and alcohol. Counselor used MI to break through patient ambivalence concerning his focus and motivation. Patient stated he "needs to attend meetings with his girlfriend since it helps his recovery". Patient then shared about an experience he had earlier in the week that he had not shared in any other session. Patient stated he left the house late one night after an argument with his parents about his girlfriend. The patient then walked to a bar/restaurant to "drink a non-alcoholic beverage" and watch a football game. Patient described feeling especially triggered, shut down, and powerless after the argument with his parents. Patient states he feels "prisoner" to his parents since they "rescued him" (adopted him) and they "constantly remind him of that fact". Counselor used open questions and reflection to build rapport, elicit immediate feeling experience, and deepen patient's experience in session. Counselor and patient continued discussion for remainder of session. Youlanda Roys, LPCA      Brandon Melnick, LCAS

## 2016-09-12 NOTE — Progress Notes (Signed)
    Daily Group Progress Note  Program: CD-IOP   09/12/2016 Edward Griffith [id number]  Diagnosis:  No diagnosis found.   Sobriety Date:   Group Time: 1-2:30 pm  Participation Level: Active  Behavioral Response: Appropriate and Sharing  Type of Therapy: Process Group  Interventions: Supportive  Topic: Process: Counselors met with patients to discuss the activities they had engaged in to support their recovery. A new group member introduced himself to the group and shared his reasons for pursuing treatment. All members were active and engaged in the discussion concerning recovery from mind-altering drugs and alcohol.   Group Time: 2:30-4 pm  Participation Level: Active  Behavioral Response: Sharing  Type of Therapy: Psycho-education Group  Interventions: Strength-based  Topic: Psychoeducation: Counselors used a DBT worksheet on interpersonal effectiveness to address patient's internalized myths that impede their recovery. Group members were asked to read faulty statements and create their own challenges to them. Counselors checked in regarding goals from the previous session. Several group members met with the Investment banker, operational. A drug test was collected from one group member.   Summary: The patient presented as moderately active and engaged in group. 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 The counselors had some reluctance, as the job is at a restaurant/bar. 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 He had a previously scheduled appointment, and left midway through the group. He reports experiencing no cravings. The patient did not attend any 12-step meetings since our group session on Monday, but he was reminded  that four meetings per week are required.  UDS collected: No Results:  AA/NA attended?: No  Sponsor?: No   Brandon Melnick, LCAS 09/12/2016 1:38 PM

## 2016-09-15 ENCOUNTER — Other Ambulatory Visit (INDEPENDENT_AMBULATORY_CARE_PROVIDER_SITE_OTHER): Payer: 59 | Admitting: Psychology

## 2016-09-15 ENCOUNTER — Encounter (HOSPITAL_COMMUNITY): Payer: Self-pay | Admitting: Medical

## 2016-09-15 DIAGNOSIS — F1020 Alcohol dependence, uncomplicated: Secondary | ICD-10-CM

## 2016-09-15 DIAGNOSIS — F1520 Other stimulant dependence, uncomplicated: Secondary | ICD-10-CM

## 2016-09-15 DIAGNOSIS — F902 Attention-deficit hyperactivity disorder, combined type: Secondary | ICD-10-CM

## 2016-09-15 DIAGNOSIS — Z811 Family history of alcohol abuse and dependence: Secondary | ICD-10-CM

## 2016-09-15 DIAGNOSIS — F1410 Cocaine abuse, uncomplicated: Secondary | ICD-10-CM

## 2016-09-15 DIAGNOSIS — Z6372 Alcoholism and drug addiction in family: Secondary | ICD-10-CM

## 2016-09-15 DIAGNOSIS — Z8659 Personal history of other mental and behavioral disorders: Secondary | ICD-10-CM

## 2016-09-15 DIAGNOSIS — F1210 Cannabis abuse, uncomplicated: Secondary | ICD-10-CM

## 2016-09-15 DIAGNOSIS — F840 Autistic disorder: Secondary | ICD-10-CM

## 2016-09-15 DIAGNOSIS — R6251 Failure to thrive (child): Secondary | ICD-10-CM | POA: Insufficient documentation

## 2016-09-15 DIAGNOSIS — R627 Adult failure to thrive: Secondary | ICD-10-CM

## 2016-09-15 NOTE — Progress Notes (Signed)
[facility] Follow-up Outpatient Visit   Date: 09/15/2016 Chief Complaint:   Chief Complaint      Subjective: "Ive just been out of it today"  HPI :Pt seen at behest of counselors due to pt behavior in group-aloof;noncommunicative.Counselors report this is first time he has been this way in group.Pt admits he has been under stress from relationship with underage fen male supplying her with alcohol and her irate mother coming to his parents house threatening to take a 50B out on him.He says they arent supposed to be in contact but [facility] others [facility] /contact but her mother found out and reacted as noted. He also claims he has ahd GI illness.  His mother informed counselors today he attempted to pick up Adderall RX but he is denying this. He didnt start Wellbutrin as scheduled.Says it seemed to help at first but mom is givinhg it to him now so he started this week.Doesnt want to go up on dose for at least 2 weeks. He tells me he is benefitting from group and wishes to remain.  Visit Diagnosis:     P   ICD-9-CM ICD-10-CM   PL                1.   Alcohol use disorder, severe, dependence (HCC) 303.90 F10.20       2.   Cannabis abuse, episodic use 305.22 F12.10       3.   Stimulant dependence (HCC) 304.40 F15.20       4.   Cocaine abuse 305.60 F14.10       5.   Failure to thrive in newborn 779.34 P92.6       6.   Failure to thrive (child) 783.41 R62.51       7.   Autism spectrum disorder 299.00 F84.0       8.   Attention deficit hyperactivity disorder (ADHD), combined type 314.01 F90.2       9.   History of posttraumatic stress disorder (PTSD) V11.8 Z86.59       10.   Family history of alcoholism in father          ICD-9-CM ICD-10-CM    Review of Systems: Psychiatric: Agitation: No/Autistim SD Hallucination: No Depressed Mood: Yes  Insomnia: Yes Hypersomnia: No Altered Concentration: ADHD rx Wellbutrin just starting /addicted to  amphetamines Feels Worthless: No Grandiose Ideas: No Belief In Special Powers: No New/Increased Substance Abuse: No Compulsions: No  Neurologic: Headache: No Seizure: No Paresthesias: No  Current Medications:  Name Dose, Frequency               buPROPion (WELLBUTRIN SR) 150 MG 12 hr tablet 150 mg, 2 times daily       Summary: Take 1 tablet (150 mg total) by mouth 2 (two) times daily., Starting Wed 09/03/2016, Until Thu 09/03/2017, Normal     EPINEPHrine 0.3 mg/0.3 mL IJ SOAJ injection             Note (09/03/2016): Received from: External          :  Mental Status Examination  Appearance:Well groomed/neat Alert: Yes Attention: good  Cooperative: Yes Eye Contact: Poor Speech: Clear and coherent Psychomotor Activity: Normal Memory/Concentration: Normal/intact Oriented: person, place, time/date and situation Mood: Dysphoric Affect: Appropriate and Congruent Thought Processes and Associations: Coherent and Intact Fund of Knowledge: Good Thought Content: WDL Insight: Limited Judgement: poor  Diagnosis: See visit diagnosis  Treatment Plan:  Continue  CDIOP treatment plan (see Initial consult)-Counselors informed of meeting.If pt continues to be as he was today in group will need to transfer to individual counseling or consider [facility] referral   Maryjean Mornharles Kelcey Wickstrom, PA-C

## 2016-09-16 ENCOUNTER — Other Ambulatory Visit (HOSPITAL_COMMUNITY): Payer: 59

## 2016-09-17 ENCOUNTER — Other Ambulatory Visit (HOSPITAL_COMMUNITY): Payer: 59 | Admitting: Psychology

## 2016-09-17 DIAGNOSIS — F1020 Alcohol dependence, uncomplicated: Secondary | ICD-10-CM

## 2016-09-17 DIAGNOSIS — F840 Autistic disorder: Secondary | ICD-10-CM

## 2016-09-17 DIAGNOSIS — F1520 Other stimulant dependence, uncomplicated: Secondary | ICD-10-CM

## 2016-09-17 DIAGNOSIS — F902 Attention-deficit hyperactivity disorder, combined type: Secondary | ICD-10-CM

## 2016-09-17 DIAGNOSIS — Z8659 Personal history of other mental and behavioral disorders: Secondary | ICD-10-CM

## 2016-09-17 NOTE — Progress Notes (Signed)
    Daily Group Progress Note  Program: CD-IOP   09/17/2016 AAIDEN DEPOY [id number]  Diagnosis:  Alcohol use disorder, severe, dependence ([place])  Cannabis abuse, episodic use  Stimulant dependence ([place])  Cocaine abuse  Failure to thrive in newborn  Failure to thrive (child)  Autism spectrum disorder  Attention deficit hyperactivity disorder (ADHD), combined type  History of posttraumatic stress disorder (PTSD)  Family history of alcoholism in father   Sobriety Date: 11/17  Group Time: 1-2:30 pm  Participation Level: Minimal  Behavioral Response: Resistant  Type of Therapy: Process Group  Interventions: Supportive  Topic: Process: The first half of group was spent in process. Members shared about the past weekend and what they had done to support their sobriety. They were also asked to identify any challenges, temptations or even successes that they experienced since we last met. Drug tests were collected from all present. The program director met with one of the group members for a medication check.   Group Time: 2:30-4 pm  Participation Level: Minimal  Behavioral Response: patient reported he didn't feel well and was very tired  Type of Therapy: Psycho-education Group  Interventions: Strength-based  Topic: Psycho-Ed: The Disease Model of Addiction. The second half of group was spent in a psycho-ed on the disease model of addiction. The four elements, or Bio-Psycho-Social- Spiritual aspects of addiction were discussed.  A slide show was provided on the neurobiology of addiction. The conversation was focused on understanding the biological nature of addiction. Members shared that it was helpful to know that their addiction wasn't the results of some inadequacy on their part, but rather a combination of things, of which they had little awareness.   Summary: The patient's presentation was quite different today than in past group sessions. He was staring  straight ahead and, when he spoke, he shared very quietly, and it was difficult to understand what he was saying. The patient reported he had been sick since Friday and spent most of the weekend in bed. The patient rescinded that somewhat when he shared that he had gone to [place] on Sunday and then attended an NA meeting at [employer]. When questioned about that location for a 12-step meeting he had little to offer. Other members questioned him and agreed that his story was very confusing. The patient reported that he was stressed out because his ex-girlfriend's mother was threatening to have a 50-B taken out on him. His report and concerns seemed exaggerated. The patient met briefly with the program director for a medication check. He shared little of himself in the psycho-ed and he had no eye contact or any reaction to the discussion in that part of group. The patient had not provided a specimen when drug tests were being prepared for shipment, but he finally provided it after group. This is not the first time he has delayed providing a sample. The patient acted much different today than in previous sessions and his behavior on Wednesday will be observed closely.    UDS collected: Yes Results: pending  AA/NA attended? No, he said he went to a meeting, but the location he identified does not exist  Sponsor?: No   Brandon Melnick, LCAS 09/17/2016 8:26 AM

## 2016-09-18 ENCOUNTER — Other Ambulatory Visit (HOSPITAL_COMMUNITY): Payer: 59 | Admitting: Psychology

## 2016-09-18 DIAGNOSIS — F1520 Other stimulant dependence, uncomplicated: Secondary | ICD-10-CM

## 2016-09-18 DIAGNOSIS — F1020 Alcohol dependence, uncomplicated: Secondary | ICD-10-CM | POA: Diagnosis not present

## 2016-09-18 DIAGNOSIS — F1210 Cannabis abuse, uncomplicated: Secondary | ICD-10-CM

## 2016-09-19 ENCOUNTER — Other Ambulatory Visit (HOSPITAL_COMMUNITY): Payer: 59

## 2016-09-22 ENCOUNTER — Other Ambulatory Visit (HOSPITAL_COMMUNITY): Payer: 59

## 2016-09-22 NOTE — Progress Notes (Signed)
    Daily Group Progress Note  Program: CD-IOP   09/22/2016 LINUS WECKERLY [id number]  Diagnosis:  Alcohol use disorder, severe, dependence ([place])  Cannabis abuse, episodic use  Stimulant dependence ([place])   Sobriety Date: 08/22/16  Group Time: 1-2:30  Participation Level: Active  Behavioral Response: Appropriate and Sharing  Type of Therapy: Process Group  Interventions: Solution Focused  Topic: Counselor met with patients for group process session with an emphasis on recovery from mind-altering drugs and alcohol. Patients were active and engaged and discussed their attempts at mindfulness, coping with triggers, and interpersonal conflicts over the break.      Group Time: 2:30-4  Participation Level: Active  Behavioral Response: Appropriate and Sharing  Type of Therapy: Psycho-education Group  Interventions: CBT  Topic: Counselors met with patients for group psychoeducation session with a discussion on challenging negative thinking. Counselor utilized a Set designer from [employer]" on Recovery Thoughts and helping patients challenge the myths they tell themselves in early recovery. Patients were responsive and engaged during group.    Summary: Patient was active and engaged and presented with markedly upbeat affect, significant eye contact, and frequent smiling. Patient stated he had "decided he was going to take control of his life". He verbalized a dissatisfaction with his parents "controling everything for him" and stated that he had thought about the consequences of his decision to commit to his girlfriend that his parents do not approve of. Patient verbalized ability to "think with perspective" about how his social interactions effect others and how he can choose to focus on his own needs and recovery. Patient stated he spoke up at a 12 step meeting for the first time which made him feel accomplished since he has serious social anxiety about speaking in public.  Patient stated he wants to make a "video confession" to his girlfriends mother explaining his past behaviors in an attempt to make amends. Patient verbalized a structured and recovery focused weekend. Wes Swan LPCA   UDS collected: No Results: negative  AA/NA attended?: YesWednesday  Sponsor?: No   Brandon Melnick, LCAS 09/22/2016 5:03 PM

## 2016-09-23 ENCOUNTER — Other Ambulatory Visit (HOSPITAL_COMMUNITY): Payer: 59

## 2016-09-23 ENCOUNTER — Encounter (HOSPITAL_COMMUNITY): Payer: Self-pay | Admitting: Psychology

## 2016-09-23 NOTE — Progress Notes (Signed)
    Daily Group Progress Note  Program: CD-IOP   09/23/2016 Edward Griffith [id number]  Diagnosis:  No diagnosis found.   Sobriety Date: 11/17  Group Time: 1-2:30 pm  Participation Level: Active  Behavioral Response: Appropriate and Sharing  Type of Therapy: Process Group  Interventions: Supportive  Topic: Process: Counselors met with patients to discuss the activities they had engaged in to support their recovery. A new group member introduced herself to the group and shared her reasons for pursuing treatment. All members were engaged in the discussion concerning recovery from mind-altering drugs and alcohol.  Group Time: 2:30-4 pm  Participation Level: Active  Behavioral Response: Appropriate and Sharing  Type of Therapy: Psycho-education Group  Interventions: Strength-based  Topic: Psychoeducation: A chaplain associated with [facility] led a discussion regarding spirituality in recovery. Counselors and patients described what spirituality looked like to them and how it related to their sobriety. Patients filled out their PHQ-9 and GAD. Several patients met with the program director to discuss medication management and intake procedures. Drug tests were collected from several members.   Summary: The patient presented as moderately active and engaged in group. He shared that he will hopefully be able to start work once he achieves a month of sobriety. He has been experiencing continued conflict with his parents and expresses the desire to move out from their home. He reports not having slept since Saturday and feeling sick when he tries to eat. The counselors/group expressed concern for his well-being and encouraged him to practice good self-care. He shared that these symptoms may be connected to the "threatening behavior" he is experiencing from his ex's mother. She has threatened to have a 50-B taken out on him if he is anywhere near her daughter. He shared that he enjoys  reading comics and cosplaying - when discussing this his affect lightened considerably. In the psycho-ed, the patient seemed to come alive when the chaplain asked him what he really enjoys. The patient shared that he makes outfits or costumes and showed some pictures. They were quite detailed and elaborate and he became more active and energized as he talked about this hobby. The patient was quite revealing in group today and shared more of himself than we have seen previously. Members agreed that he opened up and let them know a bigger part of him and they felt closer to him. The patient responded well to this intervention and remains sober despite many interpersonal struggles with family. He admitted he had not attended any 12-step meetings because he hadn't felt good. The scores were: PHQ-9: 14 GAD: 9    UDS collected: No Results:   AA/NA attended?: No  Sponsor?: No   Brandon Melnick, LCAS 09/23/2016 3:29 PM

## 2016-09-24 ENCOUNTER — Other Ambulatory Visit (HOSPITAL_COMMUNITY): Payer: 59 | Admitting: Psychology

## 2016-09-24 ENCOUNTER — Encounter (HOSPITAL_COMMUNITY): Payer: Self-pay | Admitting: Psychology

## 2016-09-24 DIAGNOSIS — F1520 Other stimulant dependence, uncomplicated: Secondary | ICD-10-CM

## 2016-09-24 DIAGNOSIS — F1020 Alcohol dependence, uncomplicated: Secondary | ICD-10-CM | POA: Diagnosis not present

## 2016-09-24 DIAGNOSIS — F902 Attention-deficit hyperactivity disorder, combined type: Secondary | ICD-10-CM

## 2016-09-25 ENCOUNTER — Other Ambulatory Visit (HOSPITAL_COMMUNITY): Payer: 59 | Admitting: Psychology

## 2016-09-25 DIAGNOSIS — F1020 Alcohol dependence, uncomplicated: Secondary | ICD-10-CM

## 2016-09-25 DIAGNOSIS — F1520 Other stimulant dependence, uncomplicated: Secondary | ICD-10-CM

## 2016-09-25 DIAGNOSIS — F840 Autistic disorder: Secondary | ICD-10-CM

## 2016-09-26 ENCOUNTER — Encounter (HOSPITAL_COMMUNITY): Payer: Self-pay | Admitting: Psychology

## 2016-09-26 ENCOUNTER — Other Ambulatory Visit (HOSPITAL_COMMUNITY): Payer: 59

## 2016-09-26 NOTE — Progress Notes (Signed)
Edward Griffith is a 25 y.o. male patient. CD-IOP FAMILY SESSION  Objective: Counselor met with patient and patient's parents for 1.5 hour family session. Counselor began session with pt alone for 5 min to discuss outcome goals for session. Pt presented as fidgety and with darting eyes. He appeared nervous and stated he did "not have high hopes for session". Counselor encouraged pt to focus on ways of improving his communication to his parents. Parents entered room and counselor asked them to discuss progress they have seen in pt since beginning tx. Mother shared she had seen "virtually no progress" and father stated he had seen "minor improvements in his hygiene" since pt is showering daily. Patient stated his parents are not often paying attention to his behaviors which his parents denied. Patient and parents became visibly frustrated with each other, describing the inaccuracies and lies in each other's' stories. Patient's parents stated he had been lying to tx team and counselor about going to a certain meeting which patient admitted was a lie. Patient was quiet and expressed hopelessness and confusion concerning his own lying behavior. Counselor asked parents to think of tangible measurable outcomes that pt can attempt while he lives at home and works on his recovery. Counselor met with pt alone for 15 min at end of session to check in about pt emotional state. Patients eyes were watery but he remained clear and concise in his verbalizations. He stated "he knew it would be bad". Counselor expressed confusion at pt's report as compared to parent's report. Counselor and patient discussed creating a weekly to-do list which would help pt recovery and sense of successfulness.   Subjective: Patient appeared nervous from the beginning of session and was walking around room, chewing gum methodically, and appearing emotionally detached from the moment. Parents expressed anger and stated they "could never trust pt"  since he has lied to them so many times. Counselor attempted to make the session more practical-focused and began asking for measurable outcomes that parents would deem "successful" for pt. Parents asked for pt to help out more around the house and had high expectations for him to "be productive while we're at work". Parents cited pt's anger and "snap backs" when they asked him to help. Patient agreed and stated he did not know how to handle his emotions since his parents make him so upset. Patient state in private with counselor that he plans to move out of his parents as soon as possible. Counselor expressed his concern that pt may not be successful living on his own given his hx of lying, stealing, and interpersonal conflict with friends. Pt agreed but stated he will try.  Youlanda Roys, LPCA     Brandon Melnick, LCAS

## 2016-09-26 NOTE — Progress Notes (Signed)
     Daily Group Progress Note  Program: CD-IOP   09/26/2016 STEPHAN NELIS [id number]  Diagnosis:  Alcohol use disorder, severe, dependence ([place])  Stimulant dependence ([place])  Attention deficit hyperactivity disorder (ADHD), combined type   Sobriety Date: 08/22/16  Group Time: 1-2:30  Participation Level: Active  Behavioral Response: Appropriate and Sharing  Type of Therapy: Process Group  Interventions: Motivational Interviewing  Topic: met with patients for 1.5 hour group process session and discussed recovery from mind-altering drugs, cravings, strategies, and resources for sobriety. Pts were active and engaged in discussion. 1 new group member was present and shared briefly and offered feedback to others. One group member became markedly tearful when discussing her recent alcoholism. UDS were collected from some members. Some members met with Darlyne Russian, program director.     Group Time: 2:30-4  Participation Level: Active  Behavioral Response: Appropriate and Sharing  Type of Therapy: Psycho-education Group  Interventions: CBT and Motivational Interviewing  Topic: Met with patients for 1.5 hour group psychoeducation session with an emphasis on AA, 12 step knowledge, and how to pursue early recovery. Patients were all active and engaged in session. Counselor offered strategies for relapse prevention, calling friends, and reaching out for help at meetings.   Summary: Patient was active and engaged in group. He reported he was feeling "very weird" since his girlfriend did not agree that he should send the "confession video" he made for her mother. Patient was open to receiving a significant amount of feedback from the group. Members told him he is possibly "addicted to his girlfriend" which he acknowledged but thought was inaccurate. Patient reported that he attended 1 AA meeting and was implementing his recovery plan daily. He stated he felt as if he was  slipping back into old patterns of addicted behavior since he had not eaten, slept well, or being taking care of himself for the past 24 hours since finding out his girlfriend did not agree with him. Youlanda Roys, LPCA   UDS collected: No Results: negative  AA/NA attended?: YesWednesday  Sponsor?: No   Brandon Melnick, LCAS 09/26/2016 12:15 PM

## 2016-09-30 ENCOUNTER — Other Ambulatory Visit (HOSPITAL_COMMUNITY): Payer: 59

## 2016-10-01 ENCOUNTER — Encounter (HOSPITAL_COMMUNITY): Payer: Self-pay | Admitting: Psychology

## 2016-10-01 ENCOUNTER — Encounter (HOSPITAL_COMMUNITY): Payer: Self-pay

## 2016-10-01 ENCOUNTER — Other Ambulatory Visit (HOSPITAL_COMMUNITY): Payer: 59 | Admitting: Psychology

## 2016-10-01 ENCOUNTER — Emergency Department (HOSPITAL_COMMUNITY)
Admission: EM | Admit: 2016-10-01 | Discharge: 2016-10-02 | Disposition: A | Discharge status: Home / Self Care | Payer: Managed Care, Other (non HMO) | Attending: Emergency Medicine | Admitting: Emergency Medicine

## 2016-10-01 DIAGNOSIS — F4325 Adjustment disorder with mixed disturbance of emotions and conduct: Secondary | ICD-10-CM | POA: Diagnosis not present

## 2016-10-01 DIAGNOSIS — Z79899 Other long term (current) drug therapy: Secondary | ICD-10-CM | POA: Insufficient documentation

## 2016-10-01 DIAGNOSIS — R45851 Suicidal ideations: Secondary | ICD-10-CM | POA: Diagnosis present

## 2016-10-01 DIAGNOSIS — Z811 Family history of alcohol abuse and dependence: Secondary | ICD-10-CM | POA: Diagnosis not present

## 2016-10-01 DIAGNOSIS — Z91010 Allergy to peanuts: Secondary | ICD-10-CM | POA: Diagnosis not present

## 2016-10-01 DIAGNOSIS — F17210 Nicotine dependence, cigarettes, uncomplicated: Secondary | ICD-10-CM | POA: Insufficient documentation

## 2016-10-01 DIAGNOSIS — Z818 Family history of other mental and behavioral disorders: Secondary | ICD-10-CM | POA: Diagnosis not present

## 2016-10-01 DIAGNOSIS — F909 Attention-deficit hyperactivity disorder, unspecified type: Secondary | ICD-10-CM | POA: Diagnosis not present

## 2016-10-01 DIAGNOSIS — Z8659 Personal history of other mental and behavioral disorders: Secondary | ICD-10-CM

## 2016-10-01 DIAGNOSIS — F1020 Alcohol dependence, uncomplicated: Secondary | ICD-10-CM | POA: Diagnosis not present

## 2016-10-01 DIAGNOSIS — F1520 Other stimulant dependence, uncomplicated: Secondary | ICD-10-CM

## 2016-10-01 HISTORY — DX: Other psychoactive substance abuse, uncomplicated: F19.10

## 2016-10-01 LAB — CBC
HCT: 42.3 % (ref 39.0–52.0)
HEMOGLOBIN: 14.8 g/dL (ref 13.0–17.0)
MCH: 31.2 pg (ref 26.0–34.0)
MCHC: 35 g/dL (ref 30.0–36.0)
MCV: 89.1 fL (ref 78.0–100.0)
Platelets: 322 10*3/uL (ref 150–400)
RBC: 4.75 MIL/uL (ref 4.22–5.81)
RDW: 10.7 % — ABNORMAL LOW (ref 11.5–15.5)
WBC: 8.3 10*3/uL (ref 4.0–10.5)

## 2016-10-01 LAB — COMPREHENSIVE METABOLIC PANEL
ALBUMIN: 5 g/dL (ref 3.5–5.0)
ALT: 31 U/L (ref 17–63)
ANION GAP: 10 (ref 5–15)
AST: 23 U/L (ref 15–41)
Alkaline Phosphatase: 78 U/L (ref 38–126)
BUN: 10 mg/dL (ref 6–20)
CHLORIDE: 101 mmol/L (ref 101–111)
CO2: 26 mmol/L (ref 22–32)
Calcium: 9.2 mg/dL (ref 8.9–10.3)
Creatinine, Ser: 1.09 mg/dL (ref 0.61–1.24)
GFR calc non Af Amer: >60 mL/min (ref >60)
Glucose, Bld: 93 mg/dL (ref 65–99)
Potassium: 3.5 mmol/L (ref 3.5–5.1)
SODIUM: 137 mmol/L (ref 135–145)
Total Bilirubin: 0.5 mg/dL (ref 0.3–1.2)
Total Protein: 8.4 g/dL — ABNORMAL HIGH (ref 6.5–8.1)

## 2016-10-01 LAB — ACETAMINOPHEN LEVEL

## 2016-10-01 LAB — SALICYLATE LEVEL

## 2016-10-01 LAB — RAPID URINE DRUG SCREEN, HOSP PERFORMED
AMPHETAMINES: NOT DETECTED
BENZODIAZEPINES: NOT DETECTED
Barbiturates: NOT DETECTED
COCAINE: NOT DETECTED
OPIATES: NOT DETECTED
TETRAHYDROCANNABINOL: NOT DETECTED

## 2016-10-01 LAB — ETHANOL: Alcohol, Ethyl (B): <5 mg/dL (ref <5)

## 2016-10-01 NOTE — Progress Notes (Signed)
Edward Griffith is a 25 y.o. male patient. CD-IOP INDIVIDUAL COUNSELING SESSION Patient met with counselor for 1.5 hour individual session from 4-5:30pm immediately following CD-IOP 3 hour group. Patient expressed significant SI and some vague HI throughout session. Counselor consulted with Beather Arbour, clinical supervisor and recommendation was made for him to have a mental health assessment at [facility] ED upon leaving this session. Patient was met by his father who spoke briefly with counselor and patient about immediate action steps to assure pt safety. Father verbalized understanding of and intent to take pt to [facility] immediately upon exiting [facility] offices for a mental health assessment. Patient went voluntarily and agreed he needed acute help with his current mental state.  In session, patient shared he felt that his emotions "were becoming too much" and that his past trauma caused him to have "anger blackouts" where he "completely sober but unable to remember his actions". He reported having a recent dream where he was "choking an unknown person and he liked it". He stated he remembers getting mad at his mother 2 days ago and reported he overheard her sharing with another family member that she "feared for her own life while in his presence alone". Patient stated he was "horrified by this but also completely understood her reasoning". Patient reported he has had past suicide attempts which resulted in residential tx. Counselor communicated to patient that he would need to seek immediate care at the [facility] ED and patient verbalized agreement and intent to get an assessment upon leaving session. Youlanda Roys, LPCA      Brandon Melnick, LCAS

## 2016-10-01 NOTE — ED Triage Notes (Addendum)
Patient reports that he is suicidal and homicidal. Patient states he has uncontrolled rage. "anything sets me off." Patient states that he has done cocaine, adderall and alcohol in the past ,but has not had since 08/22/16.  Patient states that he has been having visual and auditory hallucinations.

## 2016-10-02 ENCOUNTER — Encounter (HOSPITAL_COMMUNITY): Payer: Self-pay | Admitting: Emergency Medicine

## 2016-10-02 ENCOUNTER — Encounter (HOSPITAL_COMMUNITY): Payer: Self-pay | Admitting: Psychology

## 2016-10-02 ENCOUNTER — Other Ambulatory Visit (HOSPITAL_COMMUNITY): Payer: 59

## 2016-10-02 DIAGNOSIS — F4325 Adjustment disorder with mixed disturbance of emotions and conduct: Secondary | ICD-10-CM | POA: Diagnosis not present

## 2016-10-02 DIAGNOSIS — F17210 Nicotine dependence, cigarettes, uncomplicated: Secondary | ICD-10-CM | POA: Diagnosis not present

## 2016-10-02 DIAGNOSIS — Z91010 Allergy to peanuts: Secondary | ICD-10-CM

## 2016-10-02 DIAGNOSIS — Z811 Family history of alcohol abuse and dependence: Secondary | ICD-10-CM

## 2016-10-02 DIAGNOSIS — Z79899 Other long term (current) drug therapy: Secondary | ICD-10-CM

## 2016-10-02 DIAGNOSIS — Z818 Family history of other mental and behavioral disorders: Secondary | ICD-10-CM

## 2016-10-02 DIAGNOSIS — Z91018 Allergy to other foods: Secondary | ICD-10-CM

## 2016-10-02 MED ORDER — BUPROPION HCL ER (SR) 150 MG PO TB12
150.0000 mg | ORAL_TABLET | Freq: Two times a day (BID) | ORAL | Status: DC
Start: 2016-10-02 — End: 2016-10-02
  Administered 2016-10-02 (×2): 150 mg via ORAL
  Filled 2016-10-02 (×2): qty 1

## 2016-10-02 NOTE — ED Notes (Signed)
TTS at bedside. 

## 2016-10-02 NOTE — ED Notes (Signed)
Pt discharged home per MD order. Pt denies SI/HI/AVH at discharge. Discharge summary reviewed with pt. Pt verbalizes understanding of discharge summary and outpatient resources. Pt reports he is on his way to go to his outpatient appointment. Pt signed for personal property and property returned. Pt signed e-signature. Ambulatory off unit with MHT.

## 2016-10-02 NOTE — ED Provider Notes (Signed)
[facility]-EMERGENCY DEPT Provider Note   CSN: [id number] Arrival date & time: 10/01/16  1734     History   Chief Complaint Chief Complaint  Patient presents with  . Suicidal  . Homicidal  . Medical Clearance    HPI Edward Griffith is a 25 y.o. male.  HPI  Patient presents concerned suicidal ideation. Patient has a history of depression, substance abuse, but has been in a residential treatment facility for the past few weeks. The last substance abuse was 6 weeks ago. He notes over the past weeks has had increasing frequent thoughts of suicide, without a specific plan. Today, with concern for this his counselor referred him here for evaluation. He specifically denies any physical pain, apophysitis is that his worsening suicidal thoughts are likely due to his recent cessation of possible substance abuse.   Past Medical History:  Diagnosis Date  . Substance abuse     Patient Active Problem List   Diagnosis Date Noted  . Failure to thrive in newborn 09/15/2016  . Failure to thrive (child) 09/15/2016  . Family history of alcoholism in father 09/08/2016  . Autism spectrum disorder 09/03/2016  . Alcohol use disorder, severe, dependence (HCC) 09/03/2016  . Stimulant dependence (HCC) 09/03/2016  . Cannabis abuse, episodic use 09/03/2016  . Attention deficit hyperactivity disorder (ADHD) 09/03/2016  . History of posttraumatic stress disorder (PTSD) 09/03/2016    History reviewed. No pertinent surgical history.     Home Medications    Prior to Admission medications   Medication Sig Start Date End Date Taking? Authorizing Provider  buPROPion (WELLBUTRIN SR) 150 MG 12 hr tablet Take 1 tablet (150 mg total) by mouth 2 (two) times daily. 09/03/16 09/03/17 Yes Court Joyharles E Kober, PA-C  EPINEPHrine 0.3 mg/0.3 mL IJ SOAJ injection  06/03/16  Yes Historical Provider, MD    Family History Family History  Problem Relation Age of Onset  . Schizophrenia Mother   . Alcohol abuse  Father     Social History Social History  Substance Use Topics  . Smoking status: Current Some Day Smoker    Packs/day: 0.15    Types: Cigarettes  . Smokeless tobacco: Never Used  . Alcohol use No     Comment: sober since 08/22/16     Allergies   Atomoxetine hcl; Peanut oil; Coconut oil; Fruit extracts; Ginger; and Soy allergy   Review of Systems Review of Systems  Constitutional:       Per HPI, otherwise negative  HENT:       Per HPI, otherwise negative  Respiratory:       Per HPI, otherwise negative  Cardiovascular:       Per HPI, otherwise negative  Gastrointestinal: Negative for vomiting.  Endocrine:       Negative aside from HPI  Genitourinary:       Neg aside from HPI   Musculoskeletal:       Per HPI, otherwise negative  Skin: Negative.   Neurological: Negative for syncope.  Psychiatric/Behavioral: Positive for agitation, behavioral problems and suicidal ideas. The patient is nervous/anxious.      Physical Exam Updated Vital Signs BP 125/93 (BP Location: Right Arm)   Pulse 81   Temp 98.6 F (37 C) (Oral)   Resp 16   Ht 5\' 9"  (1.753 m)   Wt 150 lb (68 kg)   SpO2 99%   BMI 22.15 kg/m   Physical Exam  Constitutional: He is oriented to person, place, and time. He appears well-developed. No distress.  HENT:  Head: Normocephalic and atraumatic.  Eyes: Conjunctivae and EOM are normal.  Cardiovascular: Normal rate and regular rhythm.   Pulmonary/Chest: Effort normal. No stridor. No respiratory distress.  Abdominal: He exhibits no distension.  Musculoskeletal: He exhibits no edema.  Neurological: He is alert and oriented to person, place, and time.  Skin: Skin is warm and dry.  Psychiatric: He has a normal mood and affect.  Good insight into his condition.  Nursing note and vitals reviewed.    ED Treatments / Results  Labs (all labs ordered are listed, but only abnormal results are displayed) Labs Reviewed  COMPREHENSIVE METABOLIC PANEL -  Abnormal; Notable for the following:       Result Value   Total Protein 8.4 (*)    All other components within normal limits  ACETAMINOPHEN LEVEL - Abnormal; Notable for the following:    Acetaminophen (Tylenol), Serum <10 (*)    All other components within normal limits  CBC - Abnormal; Notable for the following:    RDW 10.7 (*)    All other components within normal limits  ETHANOL  SALICYLATE LEVEL  RAPID URINE DRUG SCREEN, HOSP PERFORMED      Procedures Procedures (including critical care time)  Medications Ordered in ED Medications  buPROPion (WELLBUTRIN SR) 12 hr tablet 150 mg (not administered)     Initial Impression / Assessment and Plan / ED Course  I have reviewed the triage vital signs and the nursing notes.  Pertinent labs & imaging results that were available during my care of the patient were reviewed by me and considered in my medical decision making (see chart for details).  Clinical Course     Patient medically cleared for psychiatric evaluation. No he has ongoing intermittent suicidal ideation, recent substance abuse, he is awake and alert, respiratory, labs are reassuring.   Final Clinical Impressions(s) / ED Diagnoses  Suicidal ideation   Gerhard Munchobert Vang Kraeger, MD 10/02/16 [phone]

## 2016-10-02 NOTE — Progress Notes (Signed)
    Daily Group Progress Note  Program: CD-IOP   10/02/2016 Edward Griffith [id number]  Diagnosis:  No diagnosis found.   Sobriety Date: 11/17  Group Time: 1-2:30 pm  Participation Level: Active  Behavioral Response: Sharing  Type of Therapy: Process Group  Interventions: Supportive  Topic: Process: A former member appeared today and said his 'good-byes' to his fellow group members. This counselor has asked him to come and demonstrate what a healthy farewell looks like. The patient shared about his new job and his continued commitment to sobriety. He urged group members to make the most of their time here in the program. After he left, the members engaged in a 'process' session. They shared about current issues and challenges in early recovery. One member disclosed the differences with her husband who is currently at their home in [place], [place]. she is here with her parents and their 45-monthold son. While both are in recovery, he seems to be in a considerably different place, in terms of "recovery mentality". The group provided helpful feedback and the importance of her sharing and 'venting' was encouraged and validated. During this half of group, members shared about their plans for the upcoming Christmas holiday.   Group Time: 2:30-4 pm  Participation Level: Minimal  Behavioral Response: Rationalizing and Resistant  Type of Therapy: Psycho-education Group  Interventions: Solution Focused  Topic: Psycho-Ed: "Most Common Reasons for Relapse". The second half of group was spent in a psycho-ed on the four most common reasons for relapse. A handout was provided, and the group read the handout together and discussed these four common reasons. Members identified the reasons they might be most tempted to use and/or relapse. The session emphasized identifying strategies/plans to address these reasons when they appear. Three group members were absent today.  Summary: The patient  reported he had attended one 12-step meeting last night. It is the one called, "[facility]". Although it is not listed on our most recent AA meeting schedule, another group member confirmed its existence and was familiar with it. The patient shared that he and his parents had met with his counselor yesterday after group. When asked to describe the session, the patient reported, "it was pretty awkward for me". He explained that his parents 'didn't trust me" and admitted that they had little reason to since he has lied for many years about many things. The patient shared little of himself for the remainder of the session. It remains unclear what this patient hopes to gain by attending this program. He is here because his parents are forcing him to come and he offers little input to the group process or displays any real commitment to sobriety and making change. A decision will be made when he returns from the holiday weekend.    UDS collected: No Results:   AA/NA attended?: YesWednesday  Sponsor?: No   ABrandon Melnick LFlorence12/28/2017 7:21 AM

## 2016-10-02 NOTE — ED Notes (Signed)
Patient informed of his plan of care and patients states "I don't and can't sleep in hospitals. Writer offered to get patient patient some medication for sleep and anxiety but patient refuses at this time. Encouragement and support provided and safety maintain. Q 15 min safety checks in place and video monitoring.

## 2016-10-02 NOTE — Discharge Instructions (Signed)
For your ongoing behavioral health needs, you are advised to continue treatment with the Chemical Dependency Intensive Outpatient Program at [facility]:       [facility]      [address]      [place], [place] [address]      [phone]      Contact person: Charmian Muffnn Evans, LCAS
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
## 2016-10-02 NOTE — ED Notes (Signed)
Patient educated about search process and term "contraband " and routine search performed. No contraband found. 

## 2016-10-02 NOTE — ED Notes (Signed)
Patient currently denies SI, HI and AVH at this time. Patient states that after talking to counselor all suicidal and Homicidal thoughts and AVH went away. Plan of care discussed with patient. Patient voices no complaints or concerns at this time. Encouragement and support provided and safety maintain. Q 15 min safety checks In place and video monitoring.
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
## 2016-10-02 NOTE — Progress Notes (Signed)
    Daily Group Progress Note  Program: CD-IOP   10/02/2016 TIN ENGRAM [id number]  Diagnosis:  Stimulant dependence ([name])  Alcohol use disorder, severe, dependence ([name])  History of posttraumatic stress disorder (PTSD)   Sobriety Date: 08/22/16  Group Time: 1-2:30  Participation Level: Active  Behavioral Response: Sharing, Rationalizing and Minimizing  Type of Therapy: Process Group  Interventions: CBT and Motivational Interviewing  Topic: Patients discussed their recovery from mind-altering drugs and alcohol and the challenges, successes, and events related to tx. One new pt was present. Two pts met with Darlyne Russian, PA for a discharge and entry into the program (see note). Patients shared about their holiday break and stressors from family gatherings. Some members had new sobriety dates. One member admitted to lying to the group in previous sessions and was confronted by group members about his motivation and future status in the group.     Group Time: 2:30-4  Participation Level: Active  Behavioral Response: Sharing, Rationalizing and Minimizing  Type of Therapy: Psycho-education Group  Interventions: CBT, Motivational Interviewing and Solution Focused  Topic: Counselor led discussion on honesty and seeking sobriety during difficult times such as holidays. Patients were active and engaged in discussion. Counselor led a graduation ceremony for one member who successfully discharged today.    Summary: Patient was active and engaged for his final group session. He graduated successfully at the end of session and his two parents were present for final 15 min of session in support. Patient met with program director to discuss final plans for discharge and medication. Patient was mostly quiet and reserved for his final session. He smiled frequently and seemed to appreciate the praise, feedback, and support he got from the group. Patient stated he enjoyed the  ability to "go deeper" with counselors and the group to help resolve his grief and other negative feelings. See attached note from Darlyne Russian. Edward Griffith, LPCA   UDS collected: Yes Results: negative  AA/NA attended?: YesFriday  Sponsor?: No   Edward Griffith, [place] 10/02/2016 5:24 PM

## 2016-10-02 NOTE — ED Notes (Signed)
Contact # Toma CopierYpioc Davidovich [phone] or Eugene GarnetCindy Reagle [phone]  Call when pt is discharged to update his parents per father request

## 2016-10-03 ENCOUNTER — Other Ambulatory Visit (HOSPITAL_COMMUNITY): Payer: 59

## 2016-10-07 ENCOUNTER — Telehealth (HOSPITAL_COMMUNITY): Payer: Self-pay | Admitting: Psychology

## 2016-10-07 ENCOUNTER — Other Ambulatory Visit (HOSPITAL_COMMUNITY): Payer: 59

## 2016-10-07 ENCOUNTER — Other Ambulatory Visit (HOSPITAL_COMMUNITY): Payer: 59 | Attending: Psychiatry | Admitting: Licensed Clinical Social Worker

## 2016-10-07 ENCOUNTER — Encounter (HOSPITAL_COMMUNITY): Payer: Self-pay | Admitting: Psychology

## 2016-10-07 DIAGNOSIS — F1020 Alcohol dependence, uncomplicated: Secondary | ICD-10-CM | POA: Diagnosis present

## 2016-10-07 DIAGNOSIS — F1410 Cocaine abuse, uncomplicated: Secondary | ICD-10-CM | POA: Insufficient documentation

## 2016-10-07 NOTE — Progress Notes (Signed)
Edward Griffith is a 26 y.o. male patient. CD-IOP: Discharge. Counselor spoke with the patient late today. He had met with the counselor with our [facility] program. We had referred him to the [facility] - it is more intensive and may address more of the issues he is struggling with. Last week in group, the patient reported that he didn't believe the CD-IOP had helped him stay clean and sober. He is unable to articulate how the program helps him. The patient has been caught in numerous lies about his 12-step meeting attendance, but refuses to address these issues in group sessions. Other group members became frustrated with him when he refuses to disclose any feelings about himself or his life. The patient has been attending a once a week DBT group(which we just found out about) along with individual sessions with a counselor at [facility]. He is getting too much from too many directions and we believe he will benefit from a program that addresses his autistic tendencies along with his lack of honesty or resistance to disclosing anything about himself for fear of becoming vulnerable.  In speaking to the [facility] counselor, who met with the patient and his mother today, she found him resistant to acknowledging any problems and unwilling to consider the PHP. She believes there are other issues that have yet to be addressed and that the dynamics between the parents and their son make things more difficult and confusing. It is hard to know who is telling the truth.         Brandon Melnick, LCAS

## 2016-10-07 NOTE — Psych (Signed)
Cln met with pt and pt's mother, Diona Peregoy Reichmann, to discuss referral to [facility]. Pt has been engaged in CD-IOP treatment group for approximately 6 weeks and was referred to [facility] for concerning increase in mental health symptoms, including reporting SI/HI last week necessitating a trip to the ED for assessment. Pt reports he has history with alcohol, cocaine, and stimulants, and has been sober for approximately 45 days.  Cln shared that pt had been referred due to concern that his depression and anxiety symptoms were escalating now that pt was sober and for the SI/HI reported last week. Pt denied having problematic mental health symptoms stating "I've always had depression and anxiety. It's not bad. I just live with it." Pt also minimized SI/HI stating "that's constant. I always have that but I tune it out. I would never act on it. I don't want to. I don't have a plan. I'm not worried about it." Pt states he believes his problem is that he lives at home and is bored. Pt reports no concern for his sobriety or his mental health. Pt was resistant when cln challenged him.  Pt's mother reports that she believes her son's problems stem from his autism disorder. Pt's mother reports goals for pt such as working, supporting himself, and Investment banker, [employer]. Pt's mother reports pt had an appointment with [facility] last week and is pursuing help for employment from [employer]. She states pt has also been in a DBT group at [facility] for approximately 6 months. Pt states it has been helpful since he has been sober. Pt reports goals of getting a job and moving out.  Cln educated patient and his mother about the partial hospitalization program and its purpose. Cln discussed how the program could help him achieve his current goals. Pt declined PHP services stating objections to the time commitment and stating he did not need concentrated mental health help. Pt's mother reported concerns with the DBT based curriculum since he is currently in DBT, the  short duration of the program, and that it is not autism specific.  Cln confirmed that pt does not want to engage in PHP. Pt states desire to return to CD-IOP to complete the program. Cln encouraged pt to follow up with outpatient therapy upon discharge and encouraged contacting the [employer] for further supports and referrals.  Pt denies current SI/HI and mom reports no safety concerns.   Lorin Glass, MSW, LCSW, LCAS

## 2016-10-08 ENCOUNTER — Encounter (HOSPITAL_COMMUNITY): Payer: Self-pay | Admitting: Psychology

## 2016-10-08 ENCOUNTER — Other Ambulatory Visit (HOSPITAL_COMMUNITY): Payer: 59

## 2016-10-09 ENCOUNTER — Other Ambulatory Visit (HOSPITAL_COMMUNITY): Payer: 59

## 2016-10-10 ENCOUNTER — Other Ambulatory Visit (HOSPITAL_COMMUNITY): Payer: 59

## 2016-10-13 ENCOUNTER — Other Ambulatory Visit (HOSPITAL_COMMUNITY): Payer: 59

## 2016-10-14 ENCOUNTER — Ambulatory Visit (INDEPENDENT_AMBULATORY_CARE_PROVIDER_SITE_OTHER): Payer: 59 | Admitting: Psychology

## 2016-10-14 ENCOUNTER — Other Ambulatory Visit (HOSPITAL_COMMUNITY): Payer: 59

## 2016-10-14 DIAGNOSIS — F1021 Alcohol dependence, in remission: Secondary | ICD-10-CM

## 2016-10-14 DIAGNOSIS — F840 Autistic disorder: Secondary | ICD-10-CM

## 2016-10-14 DIAGNOSIS — F902 Attention-deficit hyperactivity disorder, combined type: Secondary | ICD-10-CM

## 2016-10-15 ENCOUNTER — Other Ambulatory Visit (HOSPITAL_COMMUNITY): Payer: 59

## 2016-10-16 ENCOUNTER — Other Ambulatory Visit (HOSPITAL_COMMUNITY): Payer: 59

## 2016-10-17 ENCOUNTER — Other Ambulatory Visit (HOSPITAL_COMMUNITY): Payer: 59

## 2016-11-17 ENCOUNTER — Ambulatory Visit: Payer: Self-pay | Admitting: Psychology

## 2016-12-17 ENCOUNTER — Ambulatory Visit: Payer: 59 | Admitting: Psychology

## 2016-12-18 ENCOUNTER — Ambulatory Visit (INDEPENDENT_AMBULATORY_CARE_PROVIDER_SITE_OTHER): Payer: 59 | Admitting: Psychology

## 2016-12-18 DIAGNOSIS — F902 Attention-deficit hyperactivity disorder, combined type: Secondary | ICD-10-CM

## 2016-12-18 DIAGNOSIS — F840 Autistic disorder: Secondary | ICD-10-CM

## 2016-12-18 DIAGNOSIS — F1010 Alcohol abuse, uncomplicated: Secondary | ICD-10-CM

## 2016-12-19 ENCOUNTER — Ambulatory Visit: Payer: 59 | Admitting: Psychology

## 2017-01-28 ENCOUNTER — Ambulatory Visit (INDEPENDENT_AMBULATORY_CARE_PROVIDER_SITE_OTHER): Payer: 59 | Admitting: Psychology

## 2017-01-28 DIAGNOSIS — F840 Autistic disorder: Secondary | ICD-10-CM | POA: Diagnosis not present

## 2017-01-28 DIAGNOSIS — F902 Attention-deficit hyperactivity disorder, combined type: Secondary | ICD-10-CM

## 2017-03-09 ENCOUNTER — Ambulatory Visit: Payer: Self-pay | Admitting: Psychology

## 2019-07-01 ENCOUNTER — Other Ambulatory Visit: Payer: Self-pay

## 2019-07-01 DIAGNOSIS — Z20822 Contact with and (suspected) exposure to covid-19: Secondary | ICD-10-CM

## 2019-07-02 LAB — NOVEL CORONAVIRUS, NAA: SARS-CoV-2, NAA: NOT DETECTED

## 2021-05-05 ENCOUNTER — Other Ambulatory Visit: Payer: Self-pay

## 2021-05-05 ENCOUNTER — Encounter (HOSPITAL_COMMUNITY): Payer: Self-pay | Admitting: Emergency Medicine

## 2021-05-05 ENCOUNTER — Emergency Department (HOSPITAL_COMMUNITY): Payer: Medicare Other

## 2021-05-05 ENCOUNTER — Emergency Department (HOSPITAL_COMMUNITY)
Admission: EM | Admit: 2021-05-05 | Discharge: 2021-05-05 | Disposition: A | Discharge status: Home / Self Care | Payer: Medicare Other | Attending: Emergency Medicine | Admitting: Emergency Medicine

## 2021-05-05 DIAGNOSIS — W2209XA Striking against other stationary object, initial encounter: Secondary | ICD-10-CM | POA: Diagnosis not present

## 2021-05-05 DIAGNOSIS — F17210 Nicotine dependence, cigarettes, uncomplicated: Secondary | ICD-10-CM | POA: Diagnosis not present

## 2021-05-05 DIAGNOSIS — S6991XA Unspecified injury of right wrist, hand and finger(s), initial encounter: Secondary | ICD-10-CM | POA: Diagnosis present

## 2021-05-05 DIAGNOSIS — Z23 Encounter for immunization: Secondary | ICD-10-CM | POA: Insufficient documentation

## 2021-05-05 DIAGNOSIS — S62324A Displaced fracture of shaft of fourth metacarpal bone, right hand, initial encounter for closed fracture: Secondary | ICD-10-CM | POA: Diagnosis not present

## 2021-05-05 DIAGNOSIS — F840 Autistic disorder: Secondary | ICD-10-CM | POA: Insufficient documentation

## 2021-05-05 DIAGNOSIS — M79641 Pain in right hand: Secondary | ICD-10-CM | POA: Diagnosis not present

## 2021-05-05 DIAGNOSIS — Z91010 Allergy to peanuts: Secondary | ICD-10-CM | POA: Diagnosis not present

## 2021-05-05 MED ORDER — TETANUS-DIPHTH-ACELL PERTUSSIS 5-2.5-18.5 LF-MCG/0.5 IM SUSY
0.5000 mL | PREFILLED_SYRINGE | Freq: Once | INTRAMUSCULAR | Status: AC
  Administered 2021-05-05: 0.5 mL via INTRAMUSCULAR
  Filled 2021-05-05: qty 0.5

## 2021-05-05 NOTE — ED Triage Notes (Signed)
Patient punched the wall with his right fist and has a laceration on the top of the hand. Patient states he dont feel any pain.

## 2021-05-05 NOTE — ED Notes (Signed)
Ortho Tech at bedside.  

## 2021-05-05 NOTE — ED Notes (Signed)
Ortho tech contacted for splint 

## 2021-05-05 NOTE — Progress Notes (Signed)
Orthopedic Tech Progress Note Patient Details:  Edward Griffith [birth year] [id number]  Ortho Devices Type of Ortho Device: Ulna gutter splint Ortho Device/Splint Location: rue Ortho Device/Splint Interventions: Ordered, Application, Adjustment   Post Interventions Patient Tolerated: Well Instructions Provided: Care of device, Adjustment of device  [name] Post 05/05/2021, 5:58 AM

## 2021-05-05 NOTE — ED Provider Notes (Signed)
[facility]-EMERGENCY DEPT Provider Note   CSN: [id number] Arrival date & time: 05/05/21  0321     History Chief Complaint  Patient presents with   Extremity Laceration    Edward Griffith is a 30 y.o. male.  Patient presents to the ED with a chief complaint of right hand injury.  He states that he punched a wall about an hour ago.  He reports pain and swelling.  He sustained small wounds to his knuckles.  Last Tdap is unknown.  Denies any other injuries.  The history is provided by the patient. No language interpreter was used.      Past Medical History:  Diagnosis Date   Substance abuse [facility])     Patient Active Problem List   Diagnosis Date Noted   Adjustment disorder with mixed disturbance of emotions and conduct 10/02/2016   Failure to thrive in newborn 09/15/2016   Failure to thrive (child) 09/15/2016   Family history of alcoholism in father 09/08/2016   Autism spectrum disorder 09/03/2016   Alcohol use disorder, severe, dependence ([facility]) 09/03/2016   Stimulant dependence ([facility]) 09/03/2016   Cannabis abuse, episodic use 09/03/2016   Attention deficit hyperactivity disorder (ADHD) 09/03/2016   History of posttraumatic stress disorder (PTSD) 09/03/2016    History reviewed. No pertinent surgical history.     Family History  Problem Relation Age of Onset   Schizophrenia Mother    Alcohol abuse Father     Social History   Tobacco Use   Smoking status: Some Days    Packs/day: 0.15    Types: Cigarettes   Smokeless tobacco: Never  Substance Use Topics   Alcohol use: No    Comment: sober since 08/22/16   Drug use: No    Comment: former use of cocaine, adderall-has nnot used since 08/22/16    Home Medications Prior to Admission medications   Medication Sig Start Date End Date Taking? Authorizing Provider  buPROPion (WELLBUTRIN SR) 150 MG 12 hr tablet Take 1 tablet (150 mg total) by mouth 2 (two) times daily. 09/03/16 09/03/17  Court Joy, PA-C  EPINEPHrine 0.3 mg/0.3 mL IJ SOAJ injection  06/03/16   [provider]    Allergies    Atomoxetine hcl, Peanut oil, Fruit extracts, Ginger, Coconut oil, and Soy allergy  Review of Systems   Review of Systems  All other systems reviewed and are negative.  Physical Exam Updated Vital Signs BP (!) 146/96 (BP Location: Left Arm)   Pulse (!) 101   Temp 98.6 F (37 C) (Oral)   Resp 16   Ht 5\' 8"  (1.727 m)   Wt 72.6 kg   SpO2 97%   BMI 24.33 kg/m   Physical Exam Vitals and nursing note reviewed.  Constitutional:      General: He is not in acute distress.    Appearance: He is well-developed. He is not ill-appearing.  HENT:     Head: Normocephalic and atraumatic.  Eyes:     Conjunctiva/sclera: Conjunctivae normal.  Cardiovascular:     Rate and Rhythm: Normal rate.  Pulmonary:     Effort: Pulmonary effort is normal. No respiratory distress.  Abdominal:     General: There is no distension.  Musculoskeletal:     Cervical back: Neck supple.     Comments: Right hand swelling, TTP, reduced ROM in fingers 2/2 pain and swelling  Skin:    General: Skin is warm and dry.     Comments: Superficial abrasions to right  hand over the knuckles  Neurological:     Mental Status: He is alert and oriented to person, place, and time.  Psychiatric:        Mood and Affect: Mood normal.        Behavior: Behavior normal.    ED Results / Procedures / Treatments   Labs (all labs ordered are listed, but only abnormal results are displayed) Labs Reviewed - No data to display  EKG None  Radiology DG Hand Complete Right  Result Date: 05/05/2021 CLINICAL DATA:  Punched a wall, right hand pain EXAM: RIGHT HAND - COMPLETE 3+ VIEW COMPARISON:  None. FINDINGS: Three view radiograph right hand demonstrates an acute minimally comminuted transverse fracture of the mid diaphysis of the right fourth metacarpal with moderate volar angulation of the distal fracture fragment.  Marked overlying soft tissue swelling involving the dorsum of the right hand. No other fracture or dislocation. Joint spaces are preserved. IMPRESSION: Moderately angulated, minimally comminuted transverse fracture of the mid diaphysis of the right fourth metacarpal. Electronically Signed   By: Helyn Numbers MD   On: 05/05/2021 04:26    Procedures Procedures   Medications Ordered in ED Medications - No data to display  ED Course  I have reviewed the triage vital signs and the nursing notes.  Pertinent labs & imaging results that were available during my care of the patient were reviewed by me and considered in my medical decision making (see chart for details).    MDM Rules/Calculators/A&P                           Patient here with right hand pain.  States that he punched a wall.  Will get x-rays.  Plain films show angulated 4th metacarpal fracture.  Appears to be about 35 degrees angulated.  Cased discussed with Dr. Nicanor Alcon, who recommends hand surgery consultation due to angulation.  4:39 AM Case discussed with Dr. Arita Miss, who recommends ulnar gutter splint and follow-up in his office. Final Clinical Impression(s) / ED Diagnoses Final diagnoses:  Closed displaced fracture of shaft of fourth metacarpal bone of right hand, initial encounter    Rx / DC Orders ED Discharge Orders     None        Roxy Horseman, PA-C 05/05/21 0505    Palumbo, April, MD 05/05/21 [phone]

## 2021-05-29 ENCOUNTER — Institutional Professional Consult (permissible substitution): Payer: Medicare Other | Admitting: Plastic Surgery

## 2022-04-27 ENCOUNTER — Emergency Department (HOSPITAL_COMMUNITY)
Admission: EM | Admit: 2022-04-27 | Discharge: 2022-04-27 | Disposition: A | Discharge status: Home / Self Care | Payer: Medicare Other | Attending: Emergency Medicine | Admitting: Emergency Medicine

## 2022-04-27 ENCOUNTER — Encounter (HOSPITAL_COMMUNITY): Payer: Self-pay

## 2022-04-27 ENCOUNTER — Other Ambulatory Visit: Payer: Self-pay

## 2022-04-27 ENCOUNTER — Emergency Department (HOSPITAL_COMMUNITY): Payer: Medicare Other

## 2022-04-27 DIAGNOSIS — W2201XA Walked into wall, initial encounter: Secondary | ICD-10-CM | POA: Insufficient documentation

## 2022-04-27 DIAGNOSIS — S4991XA Unspecified injury of right shoulder and upper arm, initial encounter: Secondary | ICD-10-CM | POA: Diagnosis present

## 2022-04-27 DIAGNOSIS — S42031A Displaced fracture of lateral end of right clavicle, initial encounter for closed fracture: Secondary | ICD-10-CM | POA: Diagnosis not present

## 2022-04-27 MED ORDER — OXYCODONE-ACETAMINOPHEN 5-325 MG PO TABS: 1.0000 | ORAL_TABLET | Freq: Four times a day (QID) | ORAL | 0 refills | Status: DC | PRN

## 2022-04-27 MED ORDER — OXYCODONE-ACETAMINOPHEN 5-325 MG PO TABS
2.0000 | ORAL_TABLET | Freq: Once | ORAL | Status: AC
  Administered 2022-04-27: 2 via ORAL
  Filled 2022-04-27: qty 2
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
## 2022-04-27 NOTE — Progress Notes (Signed)
Orthopedic Tech Progress Note Patient Details:  Edward Griffith Jun 22, 1991 [id number]  Arrived shortly after receiving a call from Dr. Silverio Lay to apply sling to RUE, but it had already been placed. Advised pt he does not need the strap around his waist if it becomes uncomfortable as it has a tendency to shift and cause the arm to not be properly supported in the sling. Encouraged icing to help with any discomfort.   Patient ID: [name], male   DOB: 02-10-1991, 31 y.o.   MRN: [id number]  Docia Furl 04/27/2022, 6:12 PM

## 2022-04-27 NOTE — ED Notes (Signed)
Patient verbalizes understanding of discharge instructions. Opportunity for questioning and answers were provided. Armband removed by staff, pt discharged from ED.  

## 2022-04-27 NOTE — Discharge Instructions (Signed)
You have a distal clavicle fracture and may have impingement of the nerve  Take Motrin for pain and Percocet for severe pain.  Do not drive with Percocet  You need to call orthopedic doctor tomorrow for follow-up. You may need surgery   Return to ER if you have worse shoulder pain, numbness or weakness
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
## 2022-04-29 ENCOUNTER — Other Ambulatory Visit: Payer: Self-pay | Admitting: Orthopedic Surgery

## 2022-05-08 ENCOUNTER — Encounter (HOSPITAL_COMMUNITY): Payer: Self-pay | Admitting: Orthopedic Surgery

## 2022-05-08 NOTE — Progress Notes (Signed)
PCP - Farris Has, MD Cardiologist - pt denies EKG -  Chest x-ray -  ECHO -  Cardiac Cath -  CPAP -   ERAS Protcol - yes 1030 COVID TEST- n/a  Anesthesia review: n/a  -------------  SDW INSTRUCTIONS:  Your procedure is scheduled on 8/4. Please report to [facility] Main Entrance "A" at 1100 A.M., and check in at the Admitting office. Call this number if you have problems the morning of surgery: [phone]   Remember: Do not eat after midnight the night before your surgery  You may drink clear liquids until 10:30 AM the morning of your surgery.   Clear liquids allowed are: Water, Non-Citrus Juices (without pulp), Carbonated Beverages, Clear Tea, Black Coffee Only, and Gatorade   Medications to take morning of surgery with a sip of water include: NONE  As of today, STOP taking any Aspirin (unless otherwise instructed by your surgeon), Aleve, Naproxen, Ibuprofen, Motrin, Advil, Goody's, BC's, all herbal medications, fish oil, and all vitamins.    The Morning of Surgery Do not wear jewelry Do not wear lotions, powders, colognes, or deodorant Do not bring valuables to the hospital. [facility] is not responsible for any belongings or valuables.  If you are a smoker, DO NOT Smoke 24 hours prior to surgery  If you wear a CPAP at night please bring your mask the morning of surgery   Remember that you must have someone to transport you home after your surgery, and remain with you for 24 hours if you are discharged the same day.  Please bring cases for contacts, glasses, hearing aids, dentures or bridgework because it cannot be worn into surgery.   Patients discharged the day of surgery will not be allowed to drive home.   Please shower the NIGHT BEFORE/MORNING OF SURGERY (use antibacterial soap like DIAL soap if possible). Wear comfortable clothes the morning of surgery. Oral Hygiene is also important to reduce your risk of infection.  Remember - BRUSH YOUR TEETH THE MORNING  OF SURGERY WITH YOUR REGULAR TOOTHPASTE  Patient denies shortness of breath, fever, cough and chest pain.

## 2022-05-08 NOTE — Anesthesia Preprocedure Evaluation (Addendum)
Anesthesia Evaluation  Patient identified by MRN, date of birth, ID band Patient awake    Reviewed: Allergy & Precautions, H&P , NPO status , Patient's Chart, lab work & pertinent test results  Airway Mallampati: II  TM Distance: >3 FB Neck ROM: Full    Dental no notable dental hx. (+) Teeth Intact, Dental Advisory Given   Pulmonary Current Smoker and Patient abstained from smoking.,    Pulmonary exam normal breath sounds clear to auscultation       Cardiovascular Exercise Tolerance: Good negative cardio ROS   Rhythm:Regular Rate:Normal     Neuro/Psych negative neurological ROS  negative psych ROS   GI/Hepatic negative GI ROS, Neg liver ROS,   Endo/Other  negative endocrine ROS  Renal/GU negative Renal ROS  negative genitourinary   Musculoskeletal negative musculoskeletal ROS (+)   Abdominal   Peds  Hematology negative hematology ROS (+)   Anesthesia Other Findings ALL: atomoxiddine  Reproductive/Obstetrics negative OB ROS                           Anesthesia Physical Anesthesia Plan  ASA: 2  Anesthesia Plan: General   Post-op Pain Management: Precedex, Ofirmev IV (intra-op)* and Toradol IV (intra-op)*   Induction: Intravenous  PONV Risk Score and Plan: 2 and Midazolam, Ondansetron and Dexamethasone  Airway Management Planned: Oral ETT  Additional Equipment: None  Intra-op Plan:   Post-operative Plan: Extubation in OR  Informed Consent: I have reviewed the patients History and Physical, chart, labs and discussed the procedure including the risks, benefits and alternatives for the proposed anesthesia with the patient or authorized representative who has indicated his/her understanding and acceptance.     Dental advisory given  Plan Discussed with: CRNA and Surgeon  Anesthesia Plan Comments: (GA + RISB)     Anesthesia Quick Evaluation

## 2022-05-09 ENCOUNTER — Ambulatory Visit (HOSPITAL_COMMUNITY): Payer: Medicare Other

## 2022-05-09 ENCOUNTER — Ambulatory Visit (HOSPITAL_BASED_OUTPATIENT_CLINIC_OR_DEPARTMENT_OTHER): Payer: Medicare Other | Admitting: Anesthesiology

## 2022-05-09 ENCOUNTER — Ambulatory Visit (HOSPITAL_COMMUNITY)
Admission: RE | Admit: 2022-05-09 | Discharge: 2022-05-09 | Disposition: A | Discharge status: Home / Self Care | Payer: Medicare Other | Attending: Orthopedic Surgery | Admitting: Orthopedic Surgery

## 2022-05-09 ENCOUNTER — Other Ambulatory Visit (HOSPITAL_COMMUNITY): Payer: Self-pay

## 2022-05-09 ENCOUNTER — Other Ambulatory Visit: Payer: Self-pay

## 2022-05-09 ENCOUNTER — Ambulatory Visit (HOSPITAL_COMMUNITY): Payer: Medicare Other | Admitting: Anesthesiology

## 2022-05-09 ENCOUNTER — Encounter (HOSPITAL_COMMUNITY): Payer: Self-pay | Admitting: Orthopedic Surgery

## 2022-05-09 ENCOUNTER — Encounter (HOSPITAL_COMMUNITY)
Admission: RE | Disposition: A | Discharge status: Home / Self Care | Payer: Self-pay | Source: Home / Self Care | Attending: Orthopedic Surgery

## 2022-05-09 DIAGNOSIS — F17210 Nicotine dependence, cigarettes, uncomplicated: Secondary | ICD-10-CM | POA: Diagnosis not present

## 2022-05-09 DIAGNOSIS — S42031A Displaced fracture of lateral end of right clavicle, initial encounter for closed fracture: Secondary | ICD-10-CM | POA: Diagnosis present

## 2022-05-09 HISTORY — PX: ORIF CLAVICULAR FRACTURE: SHX5055

## 2022-05-09 LAB — COMPREHENSIVE METABOLIC PANEL
ALT: 57 U/L — ABNORMAL HIGH (ref 0–44)
AST: 43 U/L — ABNORMAL HIGH (ref 15–41)
Albumin: 3.8 g/dL (ref 3.5–5.0)
Alkaline Phosphatase: 65 U/L (ref 38–126)
Anion gap: 9 (ref 5–15)
BUN: 16 mg/dL (ref 6–20)
CO2: 23 mmol/L (ref 22–32)
Calcium: 8.8 mg/dL — ABNORMAL LOW (ref 8.9–10.3)
Chloride: 108 mmol/L (ref 98–111)
Creatinine, Ser: 0.85 mg/dL (ref 0.61–1.24)
GFR, Estimated: >60 mL/min (ref >60)
Glucose, Bld: 83 mg/dL (ref 70–99)
Potassium: 3.9 mmol/L (ref 3.5–5.1)
Sodium: 140 mmol/L (ref 135–145)
Total Bilirubin: 0.9 mg/dL (ref 0.3–1.2)
Total Protein: 6.7 g/dL (ref 6.5–8.1)

## 2022-05-09 LAB — CBC
HCT: 42 % (ref 39.0–52.0)
Hemoglobin: 13.8 g/dL (ref 13.0–17.0)
MCH: 32.8 pg (ref 26.0–34.0)
MCHC: 32.9 g/dL (ref 30.0–36.0)
MCV: 99.8 fL (ref 80.0–100.0)
Platelets: 244 10*3/uL (ref 150–400)
RBC: 4.21 MIL/uL — ABNORMAL LOW (ref 4.22–5.81)
RDW: 11.5 % (ref 11.5–15.5)
WBC: 8.5 10*3/uL (ref 4.0–10.5)
nRBC: 0 % (ref 0.0–0.2)

## 2022-05-09 SURGERY — OPEN REDUCTION INTERNAL FIXATION (ORIF) CLAVICULAR FRACTURE
Anesthesia: General | Site: Shoulder | Laterality: Right

## 2022-05-09 MED ORDER — ACETAMINOPHEN 10 MG/ML IV SOLN
INTRAVENOUS | Status: DC | PRN
  Administered 2022-05-09: 1000 mg via INTRAVENOUS

## 2022-05-09 MED ORDER — MIDAZOLAM HCL 2 MG/2ML IJ SOLN
INTRAMUSCULAR | Status: DC | PRN
  Administered 2022-05-09: 2 mg via INTRAVENOUS

## 2022-05-09 MED ORDER — MIDAZOLAM HCL 2 MG/2ML IJ SOLN
INTRAMUSCULAR | Status: AC
  Filled 2022-05-09: qty 2

## 2022-05-09 MED ORDER — ONDANSETRON HCL 4 MG/2ML IJ SOLN
INTRAMUSCULAR | Status: AC
  Filled 2022-05-09: qty 2

## 2022-05-09 MED ORDER — PROMETHAZINE HCL 12.5 MG PO TABS
12.5000 mg | ORAL_TABLET | Freq: Four times a day (QID) | ORAL | 0 refills | Status: DC | PRN
  Filled 2022-05-09: qty 30, 8d supply, fill #0

## 2022-05-09 MED ORDER — FENTANYL CITRATE (PF) 100 MCG/2ML IJ SOLN
INTRAMUSCULAR | Status: AC
  Filled 2022-05-09: qty 2

## 2022-05-09 MED ORDER — OXYCODONE HCL 5 MG PO TABS
5.0000 mg | ORAL_TABLET | Freq: Four times a day (QID) | ORAL | 0 refills | Status: DC | PRN
  Filled 2022-05-09: qty 30, 8d supply, fill #0

## 2022-05-09 MED ORDER — PHENYLEPHRINE HCL-NACL 20-0.9 MG/250ML-% IV SOLN
INTRAVENOUS | Status: DC | PRN
  Administered 2022-05-09: 25 ug/min via INTRAVENOUS

## 2022-05-09 MED ORDER — PROPOFOL 10 MG/ML IV BOLUS
INTRAVENOUS | Status: DC | PRN
  Administered 2022-05-09: 130 mg via INTRAVENOUS

## 2022-05-09 MED ORDER — OXYCODONE HCL 5 MG/5ML PO SOLN: 5.0000 mg | Freq: Once | ORAL | Status: AC | PRN

## 2022-05-09 MED ORDER — VANCOMYCIN HCL 1000 MG IV SOLR
INTRAVENOUS | Status: AC
  Filled 2022-05-09: qty 20

## 2022-05-09 MED ORDER — FENTANYL CITRATE (PF) 100 MCG/2ML IJ SOLN
25.0000 ug | INTRAMUSCULAR | Status: DC | PRN
  Administered 2022-05-09 (×2): 50 ug via INTRAVENOUS

## 2022-05-09 MED ORDER — DEXMEDETOMIDINE HCL IN NACL 80 MCG/20ML IV SOLN
INTRAVENOUS | Status: AC
  Filled 2022-05-09: qty 20

## 2022-05-09 MED ORDER — DEXAMETHASONE SODIUM PHOSPHATE 10 MG/ML IJ SOLN
INTRAMUSCULAR | Status: AC
  Filled 2022-05-09: qty 1

## 2022-05-09 MED ORDER — OXYCODONE HCL 5 MG PO TABS
5.0000 mg | ORAL_TABLET | Freq: Once | ORAL | Status: AC | PRN
  Administered 2022-05-09: 5 mg via ORAL

## 2022-05-09 MED ORDER — ROCURONIUM BROMIDE 10 MG/ML (PF) SYRINGE
PREFILLED_SYRINGE | INTRAVENOUS | Status: DC | PRN
  Administered 2022-05-09: 60 mg via INTRAVENOUS

## 2022-05-09 MED ORDER — ROCURONIUM BROMIDE 10 MG/ML (PF) SYRINGE
PREFILLED_SYRINGE | INTRAVENOUS | Status: AC
  Filled 2022-05-09: qty 10

## 2022-05-09 MED ORDER — AMISULPRIDE (ANTIEMETIC) 5 MG/2ML IV SOLN: 10.0000 mg | Freq: Once | INTRAVENOUS | Status: DC | PRN

## 2022-05-09 MED ORDER — FENTANYL CITRATE (PF) 250 MCG/5ML IJ SOLN
INTRAMUSCULAR | Status: DC | PRN
  Administered 2022-05-09 (×5): 50 ug via INTRAVENOUS

## 2022-05-09 MED ORDER — CHLORHEXIDINE GLUCONATE 0.12 % MT SOLN: 15.0000 mL | OROMUCOSAL | Status: DC

## 2022-05-09 MED ORDER — LIDOCAINE 2% (20 MG/ML) 5 ML SYRINGE
INTRAMUSCULAR | Status: AC
  Filled 2022-05-09: qty 5

## 2022-05-09 MED ORDER — CEFAZOLIN SODIUM-DEXTROSE 2-4 GM/100ML-% IV SOLN
2.0000 g | INTRAVENOUS | Status: AC
  Administered 2022-05-09: 2 g via INTRAVENOUS
  Filled 2022-05-09: qty 100

## 2022-05-09 MED ORDER — KETOROLAC TROMETHAMINE 30 MG/ML IJ SOLN
INTRAMUSCULAR | Status: AC
  Filled 2022-05-09: qty 1

## 2022-05-09 MED ORDER — ONDANSETRON HCL 4 MG/2ML IJ SOLN: 4.0000 mg | Freq: Once | INTRAMUSCULAR | Status: DC | PRN

## 2022-05-09 MED ORDER — CHLORHEXIDINE GLUCONATE 0.12 % MT SOLN
OROMUCOSAL | Status: AC
  Filled 2022-05-09: qty 15

## 2022-05-09 MED ORDER — ACETAMINOPHEN 500 MG PO TABS
1000.0000 mg | ORAL_TABLET | Freq: Once | ORAL | Status: DC
  Filled 2022-05-09: qty 2

## 2022-05-09 MED ORDER — KETOROLAC TROMETHAMINE 30 MG/ML IJ SOLN
INTRAMUSCULAR | Status: DC | PRN
  Administered 2022-05-09: 30 mg via INTRAVENOUS

## 2022-05-09 MED ORDER — OXYCODONE HCL 5 MG PO TABS
ORAL_TABLET | ORAL | Status: AC
  Filled 2022-05-09: qty 1

## 2022-05-09 MED ORDER — FENTANYL CITRATE (PF) 250 MCG/5ML IJ SOLN
INTRAMUSCULAR | Status: AC
  Filled 2022-05-09: qty 5

## 2022-05-09 MED ORDER — DEXMEDETOMIDINE (PRECEDEX) IN NS 20 MCG/5ML (4 MCG/ML) IV SYRINGE
PREFILLED_SYRINGE | INTRAVENOUS | Status: DC | PRN
  Administered 2022-05-09: 4 ug via INTRAVENOUS

## 2022-05-09 MED ORDER — PROPOFOL 10 MG/ML IV BOLUS
INTRAVENOUS | Status: AC
  Filled 2022-05-09: qty 20

## 2022-05-09 MED ORDER — VANCOMYCIN HCL 1000 MG IV SOLR
INTRAVENOUS | Status: DC | PRN
  Administered 2022-05-09: 1000 mg via TOPICAL

## 2022-05-09 MED ORDER — ONDANSETRON HCL 4 MG/2ML IJ SOLN
INTRAMUSCULAR | Status: DC | PRN
  Administered 2022-05-09: 4 mg via INTRAVENOUS

## 2022-05-09 MED ORDER — LIDOCAINE 2% (20 MG/ML) 5 ML SYRINGE
INTRAMUSCULAR | Status: DC | PRN
  Administered 2022-05-09: 60 mg via INTRAVENOUS

## 2022-05-09 MED ORDER — 0.9 % SODIUM CHLORIDE (POUR BTL) OPTIME
TOPICAL | Status: DC | PRN
  Administered 2022-05-09: 1000 mL

## 2022-05-09 MED ORDER — LACTATED RINGERS IV SOLN: INTRAVENOUS | Status: DC

## 2022-05-09 MED ORDER — GLYCOPYRROLATE PF 0.2 MG/ML IJ SOSY
PREFILLED_SYRINGE | INTRAMUSCULAR | Status: DC | PRN
  Administered 2022-05-09: .2 mg via INTRAVENOUS

## 2022-05-09 MED ORDER — ACETAMINOPHEN 10 MG/ML IV SOLN
INTRAVENOUS | Status: AC
  Filled 2022-05-09: qty 100

## 2022-05-09 MED ORDER — DEXAMETHASONE SODIUM PHOSPHATE 10 MG/ML IJ SOLN
INTRAMUSCULAR | Status: DC | PRN
  Administered 2022-05-09: 10 mg via INTRAVENOUS

## 2022-05-09 MED ORDER — SUGAMMADEX SODIUM 200 MG/2ML IV SOLN
INTRAVENOUS | Status: DC | PRN
  Administered 2022-05-09: 200 mg via INTRAVENOUS

## 2022-05-09 MED ORDER — EPINEPHRINE PF 1 MG/ML IJ SOLN
INTRAMUSCULAR | Status: AC
  Filled 2022-05-09: qty 2

## 2022-05-09 MED ORDER — CALCIUM CHLORIDE 10 % IV SOLN
INTRAVENOUS | Status: AC
  Filled 2022-05-09: qty 20

## 2022-05-09 SURGICAL SUPPLY — 71 items
BAG COUNTER SPONGE SURGICOUNT (BAG) ×3 IMPLANT
BIT DRILL 110X30X2XCALB (BIT) IMPLANT
BIT DRILL QC 2.0X100 (BIT) ×2
BIT DRL 110X30X2XCALB (BIT) ×1
BNDG COHESIVE 4X5 TAN STRL (GAUZE/BANDAGES/DRESSINGS) ×4 IMPLANT
BNDG COHESIVE 6X5 TAN ST LF (GAUZE/BANDAGES/DRESSINGS) ×1 IMPLANT
BNDG ELASTIC 3X5.8 VLCR STR LF (GAUZE/BANDAGES/DRESSINGS) ×2 IMPLANT
BNDG ELASTIC 4X5.8 VLCR STR LF (GAUZE/BANDAGES/DRESSINGS) IMPLANT
BNDG ESMARK 4X9 LF (GAUZE/BANDAGES/DRESSINGS) ×2 IMPLANT
BNDG GAUZE ELAST 4 BULKY (GAUZE/BANDAGES/DRESSINGS) IMPLANT
CORD BIPOLAR FORCEPS 12FT (ELECTRODE) ×3 IMPLANT
COVER SURGICAL LIGHT HANDLE (MISCELLANEOUS) ×3 IMPLANT
CUFF TOURN SGL QUICK 18X4 (TOURNIQUET CUFF) ×2 IMPLANT
CUFF TOURN SGL QUICK 24 (TOURNIQUET CUFF)
CUFF TRNQT CYL 24X4X16.5-23 (TOURNIQUET CUFF) ×2 IMPLANT
DERMABOND ADVANCED (GAUZE/BANDAGES/DRESSINGS) ×1
DERMABOND ADVANCED .7 DNX12 (GAUZE/BANDAGES/DRESSINGS) IMPLANT
DRAPE C-ARM 42X120 X-RAY (DRAPES) ×1 IMPLANT
DRAPE IMP U-DRAPE 54X76 (DRAPES) ×4 IMPLANT
DRAPE INCISE IOBAN 66X45 STRL (DRAPES) ×1 IMPLANT
DRAPE OEC MINIVIEW 54X84 (DRAPES) IMPLANT
DRAPE POUCH INSTRU U-SHP 10X18 (DRAPES) ×1 IMPLANT
DRSG ADAPTIC 3X8 NADH LF (GAUZE/BANDAGES/DRESSINGS) IMPLANT
DRSG AQUACEL AG ADV 3.5X10 (GAUZE/BANDAGES/DRESSINGS) ×1 IMPLANT
GAUZE SPONGE 4X4 12PLY STRL (GAUZE/BANDAGES/DRESSINGS) IMPLANT
GLOVE BIOGEL PI IND STRL 8 (GLOVE) ×2 IMPLANT
GLOVE BIOGEL PI INDICATOR 8 (GLOVE) ×1
GLOVE BIOGEL PI ORTHO PRO 7.5 (GLOVE) ×2
GLOVE PI ORTHO PRO STRL 7.5 (GLOVE) ×2 IMPLANT
GOWN STRL REUS W/ TWL LRG LVL3 (GOWN DISPOSABLE) ×2 IMPLANT
GOWN STRL REUS W/ TWL XL LVL3 (GOWN DISPOSABLE) ×2 IMPLANT
GOWN STRL REUS W/TWL LRG LVL3 (GOWN DISPOSABLE) ×2
GOWN STRL REUS W/TWL XL LVL3 (GOWN DISPOSABLE) ×2
KIT BASIN OR (CUSTOM PROCEDURE TRAY) ×3 IMPLANT
KIT STABILIZATION SHOULDER (MISCELLANEOUS) ×1 IMPLANT
KIT TURNOVER KIT B (KITS) ×3 IMPLANT
MANIFOLD NEPTUNE II (INSTRUMENTS) ×3 IMPLANT
NS IRRIG 1000ML POUR BTL (IV SOLUTION) ×3 IMPLANT
PACK ORTHO EXTREMITY (CUSTOM PROCEDURE TRAY) ×3 IMPLANT
PACK UNIVERSAL I (CUSTOM PROCEDURE TRAY) ×3 IMPLANT
PAD ARMBOARD 7.5X6 YLW CONV (MISCELLANEOUS) ×5 IMPLANT
PAD CAST 4YDX4 CTTN HI CHSV (CAST SUPPLIES) IMPLANT
PADDING CAST COTTON 4X4 STRL (CAST SUPPLIES)
PLATE LOCK HOOK LONG 2.7X12 (Plate) ×1 IMPLANT
SCREW LOCK T8 24X2.7XSTVA (Screw) IMPLANT
SCREW LOCK VA ST 2.7X20 (Screw) ×2 IMPLANT
SCREW LOCK VA ST 2.7X26 (Screw) ×1 IMPLANT
SCREW LOCKING 2.7X24MM (Screw) ×4 IMPLANT
SCREW LOCKING VA 2.7X30MM (Screw) ×1 IMPLANT
SCREW METAPHYSCAL 18MM (Screw) ×1 IMPLANT
SCREW NON LOCK 2.7X16MM (Screw) ×4 IMPLANT
SLING ARM FOAM STRAP LRG (SOFTGOODS) ×1 IMPLANT
SOL PREP POV-IOD 4OZ 10% (MISCELLANEOUS) ×2 IMPLANT
SPONGE T-LAP 4X18 ~~LOC~~+RFID (SPONGE) IMPLANT
STOCKINETTE IMPERVIOUS 9X36 MD (GAUZE/BANDAGES/DRESSINGS) ×1 IMPLANT
STRIP CLOSURE SKIN 1/2X4 (GAUZE/BANDAGES/DRESSINGS) ×1 IMPLANT
SUCTION FRAZIER HANDLE 10FR (MISCELLANEOUS)
SUCTION TUBE FRAZIER 10FR DISP (MISCELLANEOUS) IMPLANT
SUT MNCRL AB 3-0 PS2 18 (SUTURE) ×1 IMPLANT
SUT MON AB 2-0 CT1 36 (SUTURE) ×2 IMPLANT
SUT PDS AB 1 CT1 36 (SUTURE) ×2 IMPLANT
SUT VIC AB 0 CT1 27 (SUTURE) ×4
SUT VIC AB 0 CT1 27XBRD ANBCTR (SUTURE) IMPLANT
SUT VIC AB 0 CT2 27 (SUTURE) ×4 IMPLANT
SUT VIC AB 2-0 CT1 27 (SUTURE)
SUT VIC AB 2-0 CT1 TAPERPNT 27 (SUTURE) ×4 IMPLANT
TOWEL GREEN STERILE (TOWEL DISPOSABLE) ×5 IMPLANT
TUBE CONNECTING 12X1/4 (SUCTIONS) IMPLANT
UNDERPAD 30X36 HEAVY ABSORB (UNDERPADS AND DIAPERS) ×2 IMPLANT
WATER STERILE IRR 1000ML POUR (IV SOLUTION) ×3 IMPLANT
YANKAUER SUCT BULB TIP NO VENT (SUCTIONS) ×1 IMPLANT

## 2022-05-09 NOTE — Anesthesia Postprocedure Evaluation (Signed)
Anesthesia Post Note  Patient: Armonie Staten Sobel  Procedure(s) Performed: OPEN REDUCTION INTERNAL FIXATION (ORIF) RIGHT CLAVICULAR FRACTURE, (Right: Shoulder)     Patient location during evaluation: PACU Anesthesia Type: General Level of consciousness: awake and alert Pain management: pain level controlled Vital Signs Assessment: post-procedure vital signs reviewed and stable Respiratory status: spontaneous breathing, nonlabored ventilation, respiratory function stable and patient connected to nasal cannula oxygen Cardiovascular status: blood pressure returned to baseline and stable Postop Assessment: no apparent nausea or vomiting Anesthetic complications: no   No notable events documented.  Last Vitals:  Vitals:   05/09/22 1915 05/09/22 1930  BP: (!) 151/87 (!) 154/117  Pulse: 68 70  Resp: 10 14  Temp:  36.7 C  SpO2: 97% 94%    Last Pain:  Vitals:   05/09/22 1930  TempSrc:   PainSc: 5                  Collene Schlichter

## 2022-05-09 NOTE — Transfer of Care (Signed)
Immediate Anesthesia Transfer of Care Note  Patient: Edward Griffith  Procedure(s) Performed: OPEN REDUCTION INTERNAL FIXATION (ORIF) RIGHT CLAVICULAR FRACTURE, (Right: Shoulder)  Patient Location: PACU  Anesthesia Type:General  Level of Consciousness: awake and alert   Airway & Oxygen Therapy: Patient Spontanous Breathing  Post-op Assessment: Report given to RN and Post -op Vital signs reviewed and stable  Post vital signs: Reviewed and stable  Last Vitals:  Vitals Value Taken Time  BP 129/78 05/09/22 1825  Temp 36.7 C 05/09/22 1825  Pulse 63 05/09/22 1833  Resp 18 05/09/22 1833  SpO2 94 % 05/09/22 1833  Vitals shown include unvalidated device data.  Last Pain:  Vitals:   05/09/22 1825  TempSrc:   PainSc: 0-No pain      Patients Stated Pain Goal: 0 (05/09/22 1124)  Complications: No notable events documented.

## 2022-05-09 NOTE — Discharge Instructions (Signed)
Discharge instructions for Dr. Javone Ybanez, M.D.: Please refer to the two-sided discharge instructions paper that Dr. Kalen Neidert placed in the patient's paper chart. Please give this to the patient to take home after reviewing with the patient!!   General discharge instructions:  PLEASE REFER TO TWO-SIDED PAPER INSTRUCTIONS IN PAPER CHART FOR SPECIFIC INSTRUCTIONS!!!  Diet: As you were doing prior to hospitalization. Shower:  Unless otherwise specified (i.e. on two-sided paper instructions with paper chart) may shower but keep the wounds dry, use an occlusive plastic wrap, NO SOAKING IN TUB.  If the bandage gets wet, change with a clean dry gauze. Dressing:  Unless otherwise specified (i.e. on two-sided paper instructions with paper chart), may change your dressing 3-5 days after surgery.  Then change the dressing daily with sterile gauze dressing.  If there are sticky tapes (steri-strips) on your wounds and all the stitches are absorbable.  Leave the steri-strips in place when changing your dressings, they will peel off with time, usually 2-3 weeks. Activity:  Increase activity slowly as tolerated, but follow the restrictions on the two-sided paper discharge instructions sheet that Dr. Maurica Omura placed in the paper chart.  No lifting or driving for 6 weeks. Weight Bearing: NON-WEIGHTBEARING (NWB) on the RIGHT UPPER EXTREMITY. To prevent constipation: You may use over-the-counter stool softener(s) such as Colace (over the counter) 100 mg by mouth twice a day and/or Miralax (over the counter) for constipation as needed.  Drink plenty of fluids (prune juice may be helpful) and high fiber foods.  Itching:  If you experience itching with your medications, try taking only a single pain pill, or even half a pain pill at a time.  You can also use benadryl over the counter for itching or also to help with sleep.  Precautions:  If you experience chest pain or shortness of breath - call 911 immediately for transfer to  the hospital emergency department!!  PLEASE REFER TO TWO-SIDED PAPER INSTRUCTIONS IN PAPER CHART FOR SPECIFIC INSTRUCTIONS!!!  If you develop a fever greater that 101.1 deg F, purulent drainage from wound, increased redness or drainage from wound, or calf pain -- Call the office at [phone].  
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
## 2022-05-09 NOTE — Anesthesia Procedure Notes (Signed)
Procedure Name: Intubation Date/Time: 05/09/2022 3:56 PM  Performed by: Carolan Clines, CRNAPre-anesthesia Checklist: Patient identified, Emergency Drugs available, Suction available and Patient being monitored Patient Re-evaluated:Patient Re-evaluated prior to induction Oxygen Delivery Method: Circle System Utilized Preoxygenation: Pre-oxygenation with 100% oxygen Induction Type: IV induction Ventilation: Mask ventilation without difficulty Laryngoscope Size: Mac and 4 Grade View: Grade II Tube type: Oral Tube size: 7.5 mm Number of attempts: 1 Airway Equipment and Method: Stylet Placement Confirmation: ETT inserted through vocal cords under direct vision, positive ETCO2 and breath sounds checked- equal and bilateral Secured at: 23 cm Tube secured with: Tape Dental Injury: Teeth and Oropharynx as per pre-operative assessment

## 2022-05-09 NOTE — H&P (Signed)
PREOPERATIVE H&P  HPI: Edward Griffith is a 31 y.o. male who has presented today for surgery, with the diagnosis of right type 5 distal clavicle fracture.  He is complaining of intermittent numbness today in the whole RUE which comes and goes.  This does not seem to have any particular distribution.  The various methods of treatment have been discussed with the patient and family.  After consideration of risks, benefits, and other options for treatment, the patient has consented to OPEN REDUCTION INTERNAL FIXATION (ORIF) RIGHT CLAVICULAR FRACTURE, POSSIBLE SHOULDER ARTHROSCOPY, POSSIBLE CORACOCLAVICLE LIGAMENT REPAIR as a surgical intervention.  The patient's history has been reviewed, patient examined, no change in status, stable for surgery.  I have reviewed the patient's chart and labs.  Questions were answered to the patient's satisfaction.    PMH: Past Medical History:  Diagnosis Date   Substance abuse (HCC)     Home Medications Allergies  No current facility-administered medications on file prior to encounter.   Current Outpatient Medications on File Prior to Encounter  Medication Sig Dispense Refill   acetaminophen (TYLENOL) 500 MG tablet Take 1,000 mg by mouth every 6 (six) hours as needed for moderate pain.     EPINEPHrine 0.3 mg/0.3 mL IJ SOAJ injection Inject 0.3 mg into the muscle as needed for anaphylaxis.     naproxen sodium (ALEVE) 220 MG tablet Take 220 mg by mouth 2 (two) times daily.     traMADol (ULTRAM) 50 MG tablet Take 50 mg by mouth every 6 (six) hours as needed for pain.     oxyCODONE-acetaminophen (PERCOCET) 5-325 MG tablet Take 1 tablet by mouth every 6 (six) hours as needed. (Patient not taking: Reported on 05/05/2022) 10 tablet 0   Allergies  Allergen Reactions   Atomoxetine Hcl Other (See Comments)    Psychosis   Peanut Oil Swelling    Swelling of Throat and Hives   Fruit Extracts     Itchy mouth   Shrimp (Diagnostic) Nausea Only and Swelling    Throat  swelling   Coconut (Cocos Nucifera) Rash   Soy Allergy Rash     PSH: History reviewed. No pertinent surgical history.   Family History Social History  Family History  Problem Relation Age of Onset   Schizophrenia Mother    Alcohol abuse Father     Social History   Socioeconomic History   Marital status: Single    Spouse name: Not on file   Number of children: Not on file   Years of education: Not on file   Highest education level: Not on file  Occupational History   Not on file  Tobacco Use   Smoking status: Every Day    Packs/day: 0.15    Types: Cigarettes   Smokeless tobacco: Never  Vaping Use   Vaping Use: Never used  Substance and Sexual Activity   Alcohol use: Yes    Alcohol/week: 5.0 standard drinks of alcohol    Types: 5 Cans of beer per week   Drug use: Yes    Types: Cocaine    Comment: last use cocaine 3 weeks (as of 05/08/22)   Sexual activity: Not on file  Other Topics Concern   Not on file  Social History Narrative   Not on file   Social Determinants of Health   Financial Resource Strain: Not on file  Food Insecurity: Not on file  Transportation Needs: Not on file  Physical Activity: Not on file  Stress: Not on file  Social Connections:  Not on file     Review of Systems: MSK: As noted per HPI above GI: No current Nausea/vomiting ENT: Denies sore throat, epistaxis CV: Denies chest pain Resp: No current shortness of breath  Other than mentioned above, there are no Constitutional, Neurological, Psychiatric, ENT, Ophthalmological, Cardiovascular, Respiratory, GI, GU, Musculoskeletal, Integumentary, Lymphatic, Endocrine or Allergic issues.   Physical Examination: CV: Normal distal pulses Lungs: Unlabored respirations RUE: Palpable end of fracture.  Tender to palpation.  He demonstrates intact EDC, FDP index and small finger, APB, and dorsal interossei strength and function.  Intact but slightly diminished M/U/R distally compared to contralateral  side.  Normal distal pulses.  WWP distally.  Assessment/Plan: OPEN REDUCTION INTERNAL FIXATION (ORIF) RIGHT CLAVICULAR FRACTURE, POSSIBLE SHOULDER ARTHROSCOPY, POSSIBLE CORACOCLAVICLE LIGAMENT REPAIR    [name] M.D. Orthopaedic Surgery [facility]  Review of this patient's medications prescribed by other providers does not in any way constitute an endorsement by this clinician of their use, indications, dosage, route, efficacy, interactions, or other clinical parameters.  Portions of the record have been created with voice recognition software.  Grammatical and punctuation errors, random word insertions, wrong-word or "sound-a-like" substitutions, pronoun errors (inaccuracies and/or substitutions), and/or incomplete sentences may have occurred due to the inherent limitations of voice recognition software.  Not all errors are caught or corrected.  Although every attempt is made to root out erroneous and incomplete transcription, the note may still not fully represent the intent or opinion of the author.  Read the chart carefully and recognize, using context, where errors/substitutions have occurred.  Any questions or concerns about the content of this note or information contained within the body of this dictation should be addressed directly with the author for clarification.

## 2022-05-13 ENCOUNTER — Encounter (HOSPITAL_COMMUNITY): Payer: Self-pay | Admitting: Orthopedic Surgery

## 2022-05-13 NOTE — OR Nursing (Signed)
OR record under implants updated to correct screw catalog number per rep she submitted to OR team incorrect number.

## 2022-08-12 IMAGING — CR DG HAND COMPLETE 3+V*R*
3 series · 3 of 3 positions shown · non-contrast
Comparison: None.

CLINICAL DATA: Punched a wall, right hand pain

EXAM:
RIGHT HAND - COMPLETE 3+ VIEW

[x hand pa right]
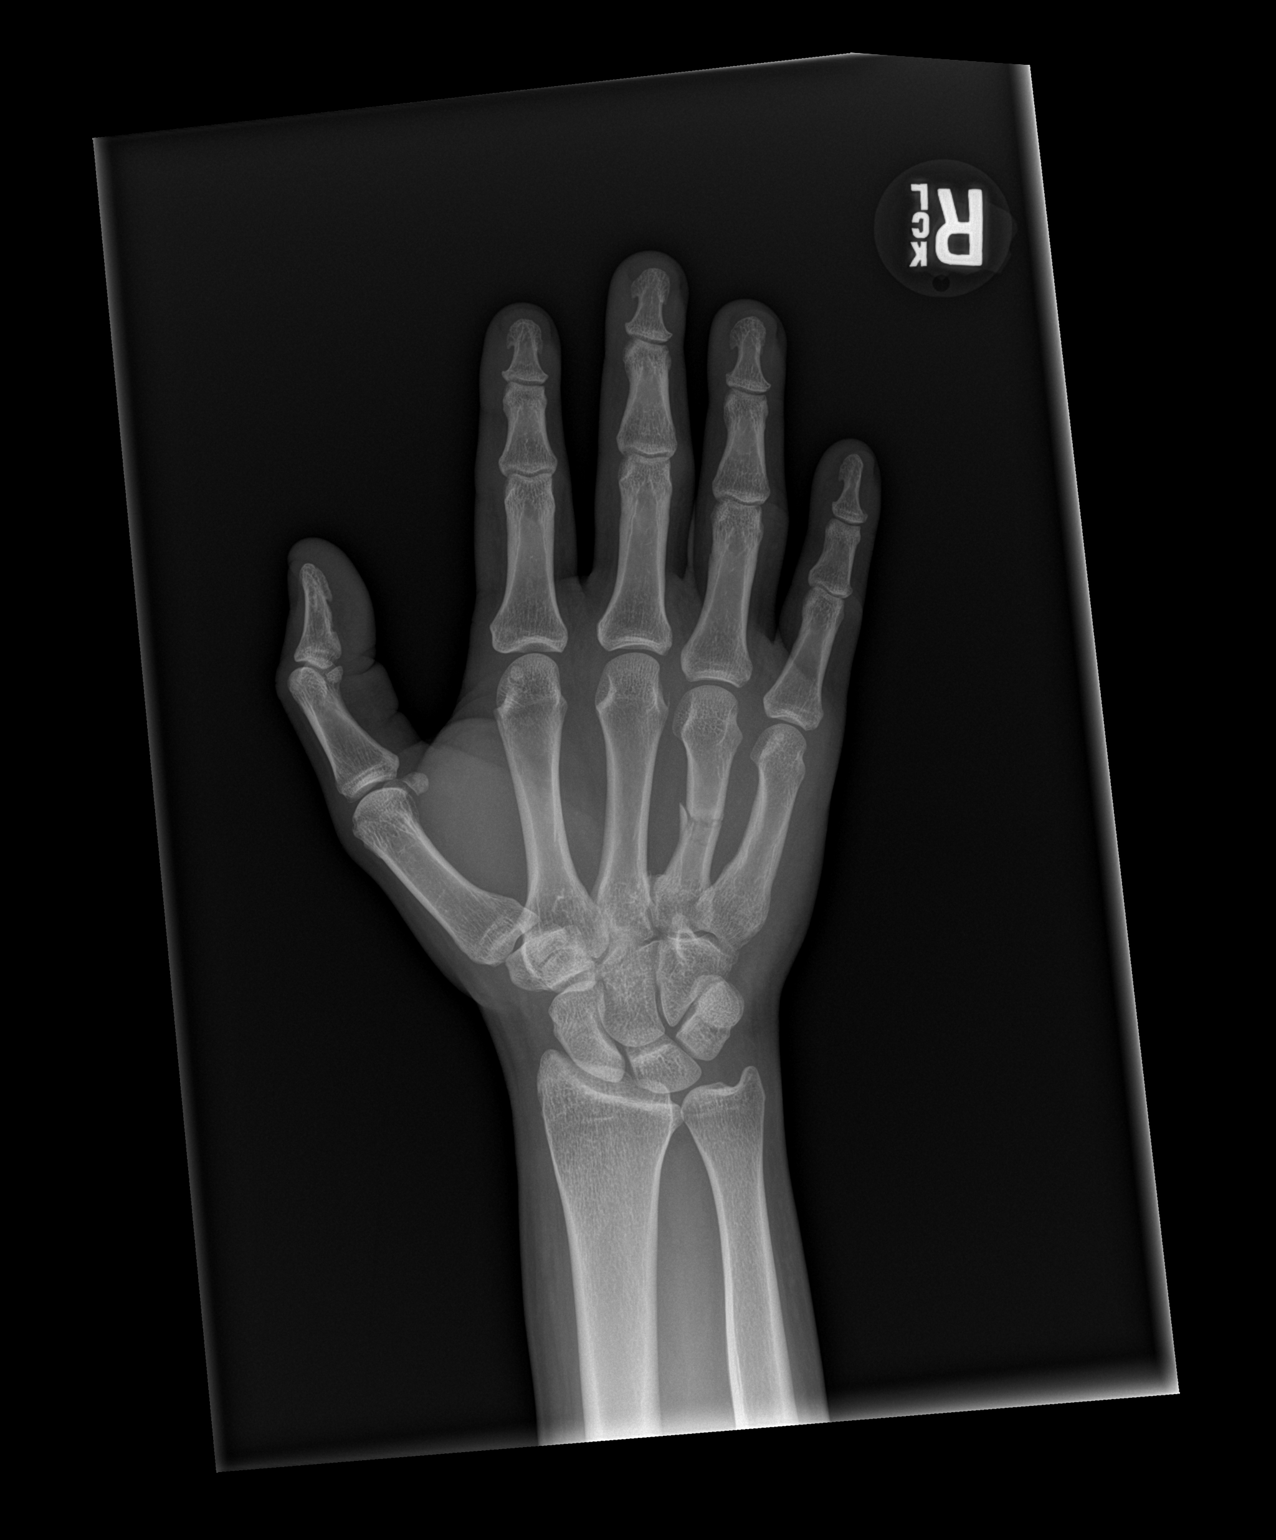

[x hand obl right]
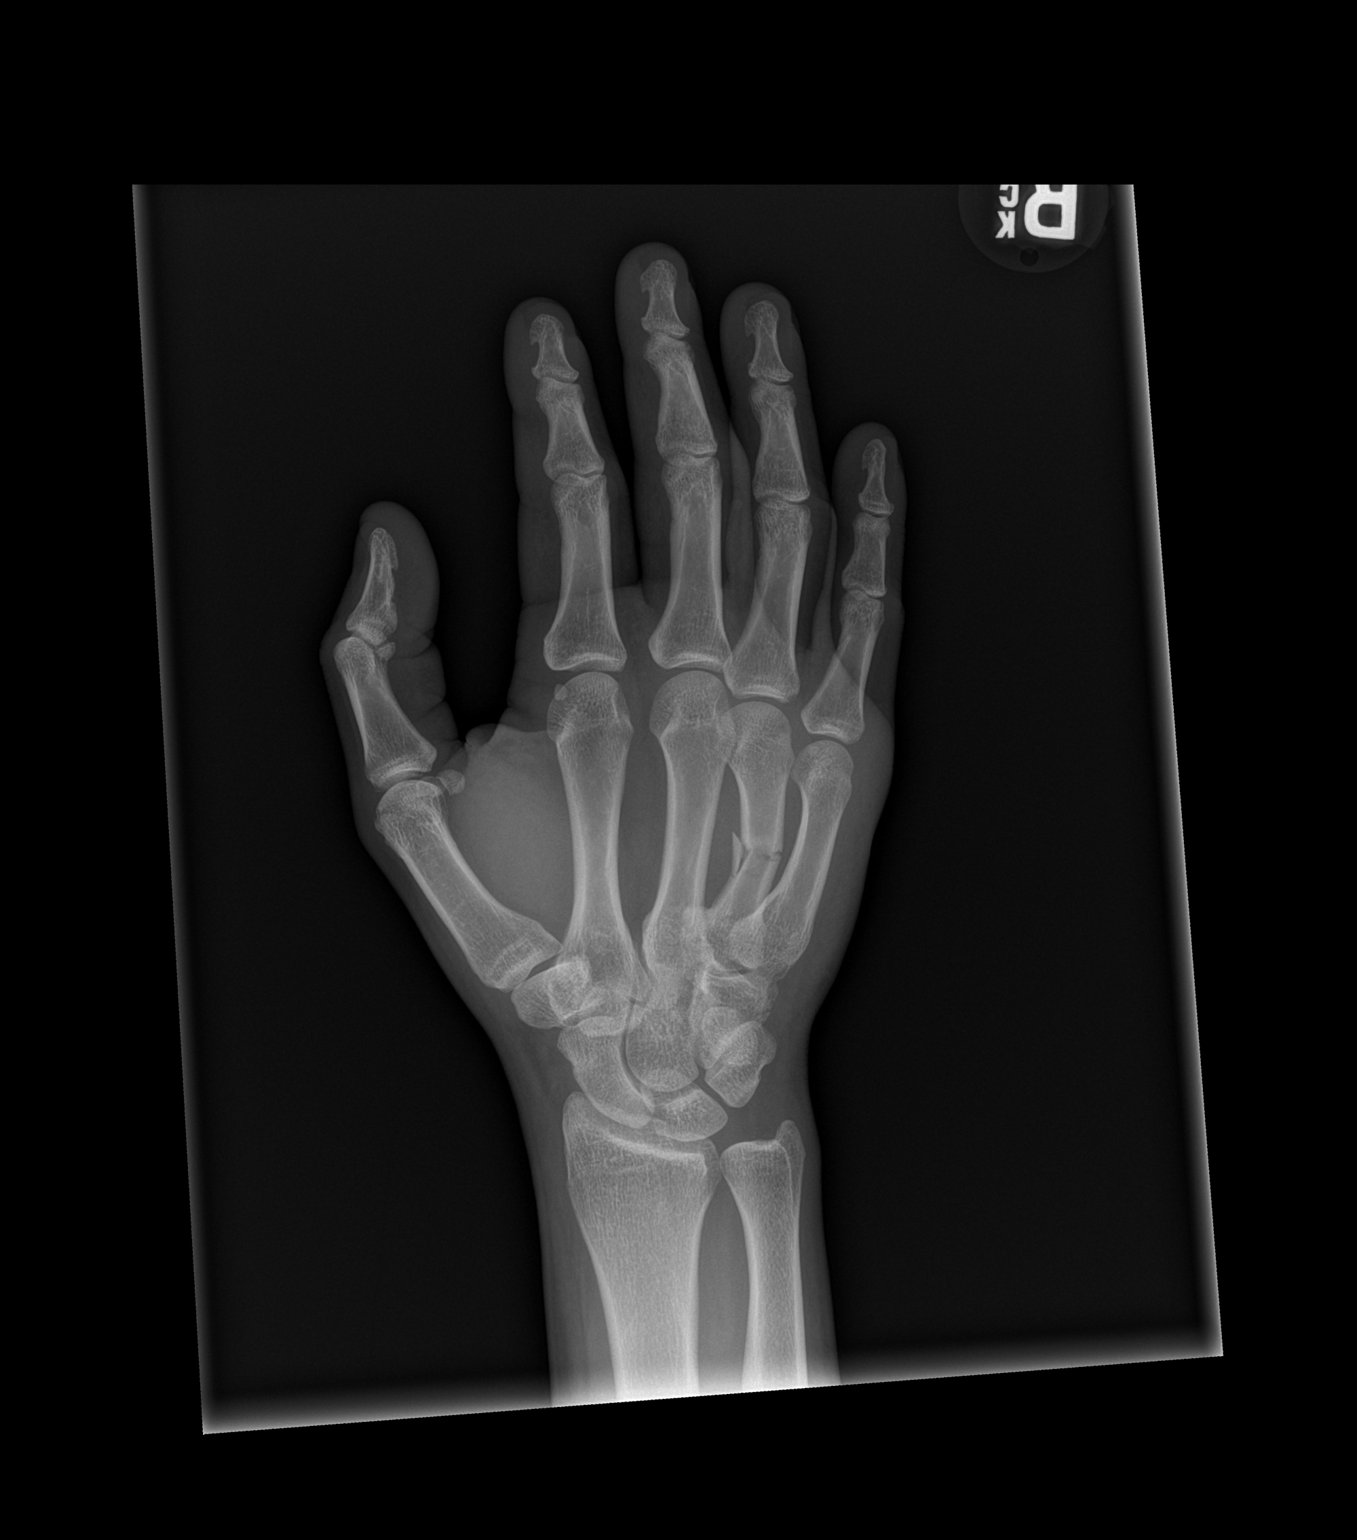

[x hand lat right]
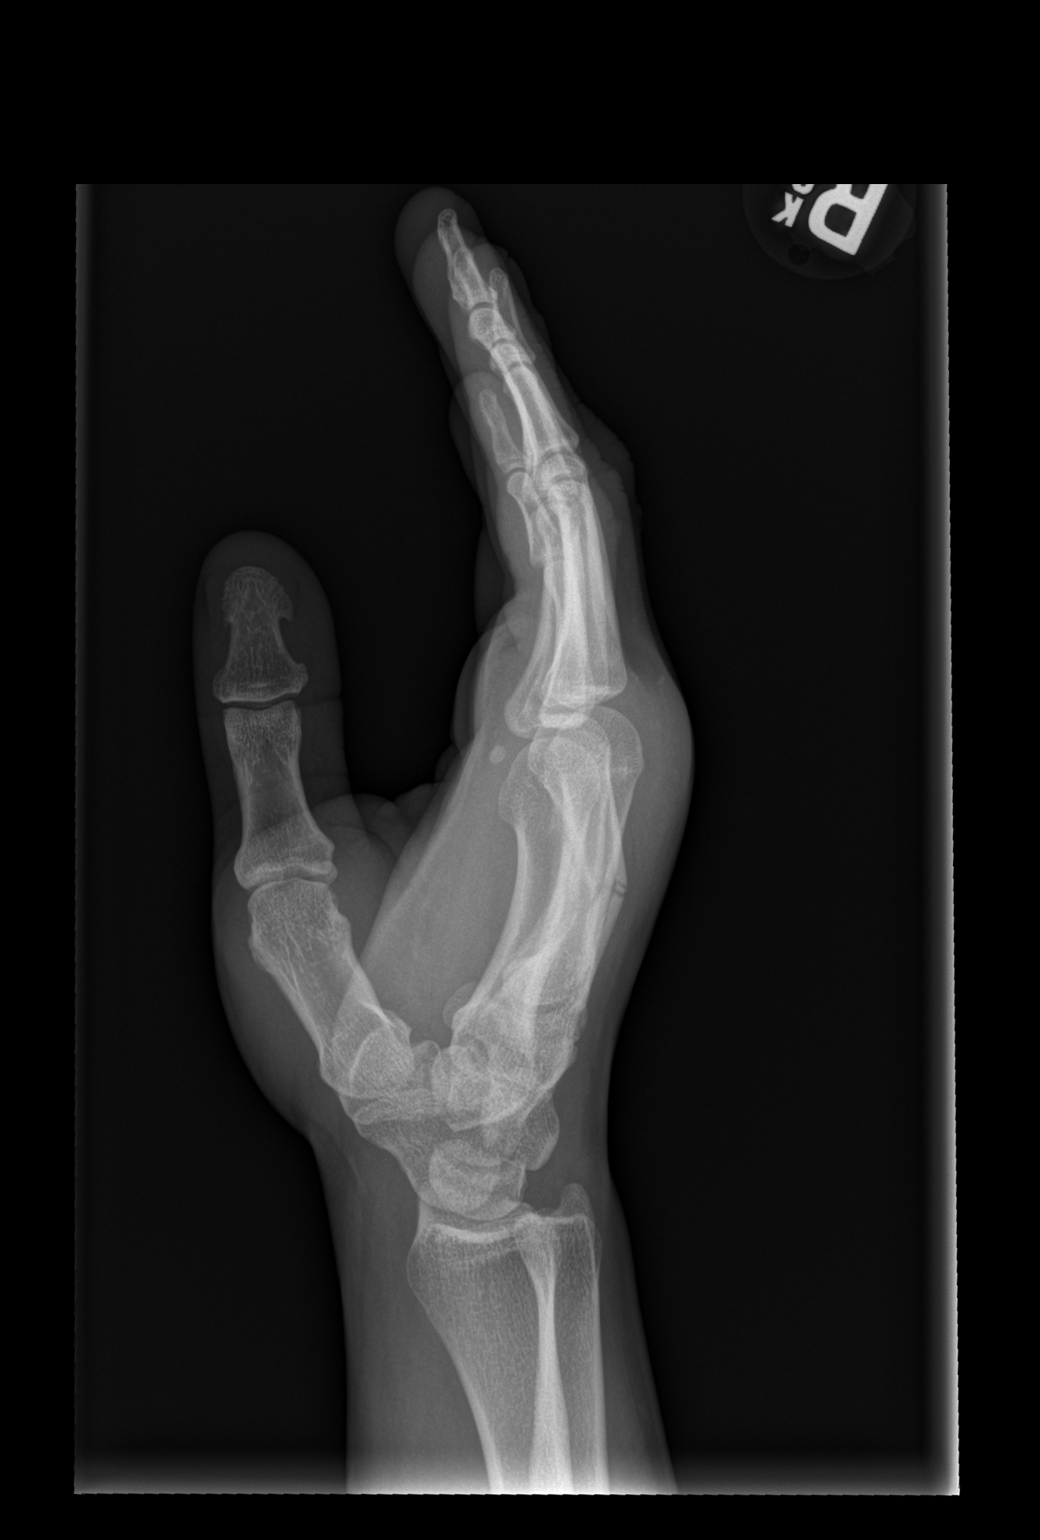

[3 of 3 positions shown; findings below may reference images not displayed]

FINDINGS: Three view radiograph right hand demonstrates an acute minimally
comminuted transverse fracture of the mid diaphysis of the right
fourth metacarpal with moderate volar angulation of the distal
fracture fragment. Marked overlying soft tissue swelling involving
the dorsum of the right hand. No other fracture or dislocation.
Joint spaces are preserved.
IMPRESSION: Moderately angulated, minimally comminuted transverse fracture of
the mid diaphysis of the right fourth metacarpal.

## 2022-08-21 ENCOUNTER — Other Ambulatory Visit: Payer: Self-pay | Admitting: Orthopedic Surgery

## 2022-08-25 ENCOUNTER — Encounter (HOSPITAL_COMMUNITY): Payer: Self-pay | Admitting: Orthopedic Surgery

## 2022-08-25 ENCOUNTER — Other Ambulatory Visit: Payer: Self-pay

## 2022-08-25 NOTE — Anesthesia Preprocedure Evaluation (Addendum)
Anesthesia Evaluation  Patient identified by MRN, date of birth, ID band Patient awake    Reviewed: Allergy & Precautions, NPO status , Patient's Chart, lab work & pertinent test results  Airway Mallampati: II  TM Distance: >3 FB Neck ROM: Full    Dental no notable dental hx. (+) Teeth Intact, Dental Advisory Given   Pulmonary asthma , Patient abstained from smoking., former smoker   Pulmonary exam normal breath sounds clear to auscultation       Cardiovascular negative cardio ROS Normal cardiovascular exam Rhythm:Regular Rate:Normal     Neuro/Psych negative neurological ROS  negative psych ROS   GI/Hepatic negative GI ROS,,,(+)     substance abuse  alcohol use and marijuana use  Endo/Other  negative endocrine ROS    Renal/GU negative Renal ROS  negative genitourinary   Musculoskeletal negative musculoskeletal ROS (+)    Abdominal   Peds  Hematology negative hematology ROS (+)   Anesthesia Other Findings   Reproductive/Obstetrics                             Anesthesia Physical Anesthesia Plan  ASA: 2  Anesthesia Plan: General   Post-op Pain Management: Tylenol PO (pre-op)*   Induction: Intravenous  PONV Risk Score and Plan: 2 and Midazolam, Dexamethasone and Ondansetron  Airway Management Planned: Oral ETT  Additional Equipment:   Intra-op Plan:   Post-operative Plan: Extubation in OR  Informed Consent: I have reviewed the patients History and Physical, chart, labs and discussed the procedure including the risks, benefits and alternatives for the proposed anesthesia with the patient or authorized representative who has indicated his/her understanding and acceptance.     Dental advisory given  Plan Discussed with: CRNA  Anesthesia Plan Comments:        Anesthesia Quick Evaluation

## 2022-08-25 NOTE — Progress Notes (Addendum)
Edward Griffith denies chest pain or shortness of breath.Patient denies having any s/s of Covid in his household, also denies any known exposure to Covid.   PCP is Dr. Farris Has.  Edward Griffith was to keep the hardware that was removed from his shoulder.  Dr. Mauricia Area told patient that he doesn't see why not.  I will check with the OR. I found out that patient can have his hardware- it will be as it came from body, it will not be cleaned and will have to sigh a consent. I called and notified patient.

## 2022-08-26 ENCOUNTER — Inpatient Hospital Stay (HOSPITAL_COMMUNITY): Payer: Medicare Other

## 2022-08-26 ENCOUNTER — Encounter (HOSPITAL_COMMUNITY)
Admission: RE | Disposition: A | Discharge status: Home / Self Care | Payer: Self-pay | Source: Ambulatory Visit | Attending: Orthopedic Surgery

## 2022-08-26 ENCOUNTER — Inpatient Hospital Stay (HOSPITAL_COMMUNITY): Payer: Medicare Other | Admitting: Anesthesiology

## 2022-08-26 ENCOUNTER — Other Ambulatory Visit (HOSPITAL_COMMUNITY): Payer: Self-pay

## 2022-08-26 ENCOUNTER — Ambulatory Visit (HOSPITAL_COMMUNITY)
Admission: RE | Admit: 2022-08-26 | Discharge: 2022-08-26 | Disposition: A | Discharge status: Home / Self Care | Payer: Medicare Other | Source: Ambulatory Visit | Attending: Orthopedic Surgery | Admitting: Orthopedic Surgery

## 2022-08-26 ENCOUNTER — Encounter (HOSPITAL_COMMUNITY): Payer: Self-pay | Admitting: Orthopedic Surgery

## 2022-08-26 DIAGNOSIS — J45909 Unspecified asthma, uncomplicated: Secondary | ICD-10-CM | POA: Insufficient documentation

## 2022-08-26 DIAGNOSIS — T8484XA Pain due to internal orthopedic prosthetic devices, implants and grafts, initial encounter: Secondary | ICD-10-CM | POA: Insufficient documentation

## 2022-08-26 DIAGNOSIS — Y831 Surgical operation with implant of artificial internal device as the cause of abnormal reaction of the patient, or of later complication, without mention of misadventure at the time of the procedure: Secondary | ICD-10-CM | POA: Insufficient documentation

## 2022-08-26 HISTORY — DX: Attention-deficit hyperactivity disorder, unspecified type: F90.9

## 2022-08-26 HISTORY — PX: HARDWARE REMOVAL: SHX979

## 2022-08-26 HISTORY — DX: Unspecified asthma, uncomplicated: J45.909

## 2022-08-26 LAB — COMPREHENSIVE METABOLIC PANEL
ALT: 24 U/L (ref 0–44)
AST: 29 U/L (ref 15–41)
Albumin: 4 g/dL (ref 3.5–5.0)
Alkaline Phosphatase: 54 U/L (ref 38–126)
Anion gap: 13 (ref 5–15)
BUN: 19 mg/dL (ref 6–20)
CO2: 22 mmol/L (ref 22–32)
Calcium: 9.2 mg/dL (ref 8.9–10.3)
Chloride: 103 mmol/L (ref 98–111)
Creatinine, Ser: 0.84 mg/dL (ref 0.61–1.24)
GFR, Estimated: >60 mL/min (ref >60)
Glucose, Bld: 91 mg/dL (ref 70–99)
Potassium: 4.3 mmol/L (ref 3.5–5.1)
Sodium: 138 mmol/L (ref 135–145)
Total Bilirubin: 0.6 mg/dL (ref 0.3–1.2)
Total Protein: 7 g/dL (ref 6.5–8.1)

## 2022-08-26 LAB — CBC
HCT: 43.6 % (ref 39.0–52.0)
Hemoglobin: 14.6 g/dL (ref 13.0–17.0)
MCH: 32.6 pg (ref 26.0–34.0)
MCHC: 33.5 g/dL (ref 30.0–36.0)
MCV: 97.3 fL (ref 80.0–100.0)
Platelets: 299 10*3/uL (ref 150–400)
RBC: 4.48 MIL/uL (ref 4.22–5.81)
RDW: 10.6 % — ABNORMAL LOW (ref 11.5–15.5)
WBC: 4.6 10*3/uL (ref 4.0–10.5)
nRBC: 0 % (ref 0.0–0.2)

## 2022-08-26 SURGERY — REMOVAL, HARDWARE
Anesthesia: General | Site: Shoulder | Laterality: Right

## 2022-08-26 SURGERY — REMOVAL, HARDWARE
Anesthesia: Choice | Laterality: Right

## 2022-08-26 MED ORDER — CHLORHEXIDINE GLUCONATE 0.12 % MT SOLN
OROMUCOSAL | Status: AC
  Administered 2022-08-26: 15 mL via OROMUCOSAL
  Filled 2022-08-26: qty 15

## 2022-08-26 MED ORDER — EPHEDRINE SULFATE-NACL 50-0.9 MG/10ML-% IV SOSY
PREFILLED_SYRINGE | INTRAVENOUS | Status: DC | PRN
  Administered 2022-08-26: 10 mg via INTRAVENOUS

## 2022-08-26 MED ORDER — FENTANYL CITRATE (PF) 100 MCG/2ML IJ SOLN: 25.0000 ug | INTRAMUSCULAR | Status: DC | PRN

## 2022-08-26 MED ORDER — ORAL CARE MOUTH RINSE: 15.0000 mL | Freq: Once | OROMUCOSAL | Status: AC

## 2022-08-26 MED ORDER — LIDOCAINE-EPINEPHRINE 2 %-1:100000 IJ SOLN
INTRAMUSCULAR | Status: AC
  Filled 2022-08-26: qty 2

## 2022-08-26 MED ORDER — LACTATED RINGERS IV SOLN: INTRAVENOUS | Status: DC

## 2022-08-26 MED ORDER — LIDOCAINE-EPINEPHRINE 2 %-1:100000 IJ SOLN
INTRAMUSCULAR | Status: DC | PRN
  Administered 2022-08-26: 20 mL via INTRADERMAL

## 2022-08-26 MED ORDER — PROPOFOL 10 MG/ML IV BOLUS
INTRAVENOUS | Status: DC | PRN
  Administered 2022-08-26: 150 mg via INTRAVENOUS

## 2022-08-26 MED ORDER — ROCURONIUM BROMIDE 10 MG/ML (PF) SYRINGE
PREFILLED_SYRINGE | INTRAVENOUS | Status: DC | PRN
  Administered 2022-08-26: 60 mg via INTRAVENOUS

## 2022-08-26 MED ORDER — MIDAZOLAM HCL 2 MG/2ML IJ SOLN
INTRAMUSCULAR | Status: DC | PRN
  Administered 2022-08-26: 2 mg via INTRAVENOUS

## 2022-08-26 MED ORDER — DEXAMETHASONE SODIUM PHOSPHATE 10 MG/ML IJ SOLN
INTRAMUSCULAR | Status: DC | PRN
  Administered 2022-08-26: 10 mg via INTRAVENOUS

## 2022-08-26 MED ORDER — CHLORHEXIDINE GLUCONATE 0.12 % MT SOLN: 15.0000 mL | Freq: Once | OROMUCOSAL | Status: AC

## 2022-08-26 MED ORDER — SUGAMMADEX SODIUM 200 MG/2ML IV SOLN
INTRAVENOUS | Status: DC | PRN
  Administered 2022-08-26: 200 mg via INTRAVENOUS

## 2022-08-26 MED ORDER — LIDOCAINE 2% (20 MG/ML) 5 ML SYRINGE
INTRAMUSCULAR | Status: DC | PRN
  Administered 2022-08-26: 50 mg via INTRAVENOUS

## 2022-08-26 MED ORDER — DEXMEDETOMIDINE HCL IN NACL 80 MCG/20ML IV SOLN
INTRAVENOUS | Status: DC | PRN
  Administered 2022-08-26: 8 ug via INTRAVENOUS
  Administered 2022-08-26: 12 ug via INTRAVENOUS

## 2022-08-26 MED ORDER — TRAMADOL HCL 50 MG PO TABS
50.0000 mg | ORAL_TABLET | Freq: Four times a day (QID) | ORAL | 0 refills | Status: DC | PRN
  Filled 2022-08-26: qty 30, 8d supply, fill #0

## 2022-08-26 MED ORDER — CEFAZOLIN SODIUM-DEXTROSE 2-4 GM/100ML-% IV SOLN
2.0000 g | INTRAVENOUS | Status: AC
  Administered 2022-08-26: 2 g via INTRAVENOUS

## 2022-08-26 MED ORDER — CEFAZOLIN SODIUM-DEXTROSE 2-4 GM/100ML-% IV SOLN
INTRAVENOUS | Status: AC
  Filled 2022-08-26: qty 100

## 2022-08-26 MED ORDER — ONDANSETRON HCL 4 MG/2ML IJ SOLN
INTRAMUSCULAR | Status: DC | PRN
  Administered 2022-08-26: 4 mg via INTRAVENOUS

## 2022-08-26 MED ORDER — BUPIVACAINE HCL (PF) 0.5 % IJ SOLN
INTRAMUSCULAR | Status: AC
  Filled 2022-08-26: qty 30

## 2022-08-26 MED ORDER — 0.9 % SODIUM CHLORIDE (POUR BTL) OPTIME
TOPICAL | Status: DC | PRN
  Administered 2022-08-26: 1000 mL

## 2022-08-26 MED ORDER — ACETAMINOPHEN 500 MG PO TABS: 1000.0000 mg | ORAL_TABLET | Freq: Once | ORAL | Status: AC

## 2022-08-26 MED ORDER — ACETAMINOPHEN 500 MG PO TABS
ORAL_TABLET | ORAL | Status: AC
  Administered 2022-08-26: 1000 mg via ORAL
  Filled 2022-08-26: qty 2

## 2022-08-26 MED ORDER — ROCURONIUM BROMIDE 10 MG/ML (PF) SYRINGE
PREFILLED_SYRINGE | INTRAVENOUS | Status: AC
  Filled 2022-08-26: qty 10

## 2022-08-26 MED ORDER — FENTANYL CITRATE (PF) 250 MCG/5ML IJ SOLN
INTRAMUSCULAR | Status: DC | PRN
  Administered 2022-08-26 (×2): 50 ug via INTRAVENOUS
  Administered 2022-08-26: 100 ug via INTRAVENOUS

## 2022-08-26 MED ORDER — MIDAZOLAM HCL 2 MG/2ML IJ SOLN
INTRAMUSCULAR | Status: AC
  Filled 2022-08-26: qty 2

## 2022-08-26 MED ORDER — PROPOFOL 10 MG/ML IV BOLUS
INTRAVENOUS | Status: AC
  Filled 2022-08-26: qty 20

## 2022-08-26 MED ORDER — FENTANYL CITRATE (PF) 250 MCG/5ML IJ SOLN
INTRAMUSCULAR | Status: AC
  Filled 2022-08-26: qty 5

## 2022-08-26 SURGICAL SUPPLY — 38 items
CLSR STERI-STRIP ANTIMIC 1/2X4 (GAUZE/BANDAGES/DRESSINGS) IMPLANT
COVER SURGICAL LIGHT HANDLE (MISCELLANEOUS) ×1 IMPLANT
DERMABOND ADVANCED .7 DNX12 (GAUZE/BANDAGES/DRESSINGS) IMPLANT
DRAPE C-ARM 42X120 X-RAY (DRAPES) ×1 IMPLANT
DRAPE HALF SHEET 40X57 (DRAPES) IMPLANT
DRAPE INCISE IOBAN 66X45 STRL (DRAPES) IMPLANT
DRAPE U-SHAPE 47X51 STRL (DRAPES) ×1 IMPLANT
DRSG ADAPTIC 3X8 NADH LF (GAUZE/BANDAGES/DRESSINGS) IMPLANT
DRSG AQUACEL AG ADV 3.5X10 (GAUZE/BANDAGES/DRESSINGS) IMPLANT
DRSG MEPILEX POST OP 4X12 (GAUZE/BANDAGES/DRESSINGS) IMPLANT
ELECT REM PT RETURN 15FT ADLT (MISCELLANEOUS) ×1 IMPLANT
GAUZE PAD ABD 8X10 STRL (GAUZE/BANDAGES/DRESSINGS) ×1 IMPLANT
GAUZE SPONGE 4X4 12PLY STRL (GAUZE/BANDAGES/DRESSINGS) ×1 IMPLANT
GLOVE BIO SURGEON STRL SZ8 (GLOVE) ×1 IMPLANT
GLOVE BIOGEL PI IND STRL 6.5 (GLOVE) ×1 IMPLANT
GLOVE BIOGEL PI IND STRL 8 (GLOVE) ×1 IMPLANT
GLOVE ECLIPSE 7.5 STRL STRAW (GLOVE) ×1 IMPLANT
GLOVE SURG POLYISO LF SZ6.5 (GLOVE) ×1 IMPLANT
GOWN STRL REUS W/ TWL XL LVL3 (GOWN DISPOSABLE) ×1 IMPLANT
GOWN STRL REUS W/TWL XL LVL3 (GOWN DISPOSABLE) ×1
KIT TURNOVER KIT A (KITS) IMPLANT
MANIFOLD NEPTUNE II (INSTRUMENTS) ×1 IMPLANT
NEEDLE HYPO 22GX1.5 SAFETY (NEEDLE) ×1 IMPLANT
PACK ORTHO EXTREMITY (CUSTOM PROCEDURE TRAY) ×1 IMPLANT
PAD CAST 4YDX4 CTTN HI CHSV (CAST SUPPLIES) ×2 IMPLANT
PADDING CAST COTTON 4X4 STRL (CAST SUPPLIES)
PENCIL SMOKE EVACUATOR (MISCELLANEOUS) IMPLANT
SPIKE FLUID TRANSFER (MISCELLANEOUS) ×1 IMPLANT
SUT ETHILON 4 0 PS 2 18 (SUTURE) ×2 IMPLANT
SUT MNCRL AB 3-0 PS2 27 (SUTURE) IMPLANT
SUT MNCRL+ AB 3-0 CT1 36 (SUTURE) IMPLANT
SUT MON AB 2-0 CT1 36 (SUTURE) IMPLANT
SUT MONOCRYL AB 3-0 CT1 36IN (SUTURE) ×1
SUT PDS AB 0 CT1 27 (SUTURE) IMPLANT
SUT PDS AB 1 CTX 36 (SUTURE) IMPLANT
SUT VIC AB 2-0 CT1 27 (SUTURE)
SUT VIC AB 2-0 CT1 TAPERPNT 27 (SUTURE) ×1 IMPLANT
SYR CONTROL 10ML LL (SYRINGE) ×1 IMPLANT

## 2022-08-26 NOTE — Op Note (Signed)
OPERATIVE NOTE  Edward Griffith male 31 y.o. 08/26/2022  PREOPERATIVE DIAGNOSIS: Painful retained hardware, hook plate, right shoulder, s/p previous distal clavicle fracture ORIF  POSTOPERATIVE DIAGNOSIS: Painful retained hardware, hook plate, right shoulder, s/p previous distal clavicle fracture ORIF (T84.84XA)  PROCEDURE(S): Removal of hardware, deep, right clavicle [id number]) Operative use of fluoroscopy for above procedure(s) [id number])   SURGEON: Georgeanna Harrison, M.D.  ASSISTANT(S): None  ANESTHESIA: General  FINDINGS: Preoperative Examination: RUE: Well-healed benign-appearing incision.  Only mild tenderness about the area of the previous fracture today, with some mild discomfort on cross body abduction.  Shoulder range of motion is well-preserved and unchanged from examination in clinic.  He does have some discomfort with Neer, Jobe, and Hawkins test.  5/5 supraspinatus, 5/5 infraspinatus, 5/5 subscapularis isolation strength.  He is able to demonstrate intact APB, EDC, FDP index and small finger, and dorsal interossei function.  Patient tact light touch distally in median, radial, and ulnar distributions.  Normal radial pulse.  Warm and well-perfused distally.   Operative Findings: Healed distal clavicle fracture.  Removal of hook plate and screws, confirmed fluoroscopically.  IMPLANTS: * No implants in log *  INDICATIONS:  The patient is a 31 y.o. male who previously underwent ORIF of a type V right distal clavicle fracture with a hook plate.  The fracture had healed and he was therefore recommended to undergo plate removal.  He understood the risks, benefits and alternatives to surgery which include but are not limited to bleeding, wound healing complications, infection, damage to surrounding structures, persistent pain, stiffness, lack of improvement, potential for subsequent arthritis or worsening of pre-existing arthritis, and need for further surgery, as well as complications  related to anesthesia, cardiovascular complications, and death.  He also understood the potential for continued pain, and that there were no guarantees of acceptable outcome.  After weighing these risks the patient opted to proceed with surgery.  TECHNIQUE: Patient was identified in the preoperative holding area.  The right shoulder was confirmed as the appropriate operative site and marked by me.  Consent was signed by myself and the patient and witnessed by the preoperative nurse.  No block was performed by anesthesia in the preoperative holding area.  Patient was taken to the operative suite and placed supine on the operative table.  Anesthesia was induced by the anesthesia team.  The patient was positioned appropriately for the procedure and all bony prominences were well padded.  A tourniquet was not used.  Preoperative antibiotics were given. The extremity was prepped and draped in the usual sterile fashion and surgical timeout was performed.  The previous incision was infiltrated with 2% lidocaine with epinephrine.  The plate was visualized fluoroscopically.  The previous incision was incised sharply.  Underlying subcutaneous tissue was dissected with Bovie electrocautery down to fascia layer.  Fascia was split with Bovie electrocautery, exposing the plate.  The plate was fully exposed and freed of soft tissue attachments.  All screws were removed from the plate, and the plate was then extracted without difficulty.  Areas of bony hypertrophy at the screw hole sites were debrided with rongeurs.  Fluoroscopy was used to confirm complete removal of all internal fixation.  The wound was copiously irrigated and hemostasis was obtained.  Layered closure was performed with interrupted #1 PDS figure-of-eight deep, followed by running #0 PDS in the platysma layer, followed by simple inverted erupted #2 Monocryl deep dermal, followed by running #3 Monocryl subcuticular.  Skin was sealed Dermabond.  Suture tails  secured  with Steri-Strips.  Aquacel dressing was placed over the wound.  Patient was awakened from anesthesia and transferred to PACU in stable condition.  He tolerated procedure well.  No complications were noted intraoperatively.  POSTOPERATIVE INSTRUCTIONS: Mobility: As tolerated, avoiding strenuous activity with right upper extremity Pain control: Continue to wean/titrate to appropriate oral regimen RUE: Weightbearing as tolerated, limiting strenuous activity until follow-up Dressing care: Keep AQUACEL on and dry for up to 14 days.  Do not allow surgical area to get wet before that.  Remove AQUACEL dressing after 14 days and allow area to get wet in shower but DO NOT SUBMERGE until wound is evaluated in clinic.  In most cases skin glue is used and no additional dressing is necessary.  Disposition: Home Follow-up: Please call [facility] [phone]) to schedule follow-op appointment for 2 weeks after surgery.  TOURNIQUET TIME: * No tourniquets in log *  BLOOD LOSS: 10 mL         DRAINS: None         SPECIMEN: None       COMPLICATIONS:  * No complications entered in OR log *         DISPOSITION: PACU - hemodynamically stable.         CONDITION: stable   [name] M.D. Orthopaedic Surgery [facility]   Portions of the record have been created with voice recognition software.  Grammatical and punctuation errors, random word insertions, wrong-word or "sound-a-like" substitutions, pronoun errors (inaccuracies and/or substitutions), and/or incomplete sentences may have occurred due to the inherent limitations of voice recognition software.  Not all errors are caught or corrected.  Although every attempt is made to root out erroneous and incomplete transcription, the note may still not fully represent the intent or opinion of the author.  Read the chart carefully and recognize, using context, where errors/substitutions have  occurred.  Any questions or concerns about the content of this note or information contained within the body of this dictation should be addressed directly with the author for clarification.

## 2022-08-26 NOTE — Anesthesia Procedure Notes (Signed)
Procedure Name: Intubation Date/Time: 08/26/2022 11:51 AM  Performed by: Rosiland Oz, CRNAPre-anesthesia Checklist: Patient identified, Emergency Drugs available, Suction available, Patient being monitored and Timeout performed Patient Re-evaluated:Patient Re-evaluated prior to induction Oxygen Delivery Method: Circle system utilized Preoxygenation: Pre-oxygenation with 100% oxygen Induction Type: IV induction Ventilation: Mask ventilation without difficulty Laryngoscope Size: Miller and 3 Grade View: Grade I Tube type: Oral Tube size: 7.5 mm Number of attempts: 1 Airway Equipment and Method: Stylet Placement Confirmation: ETT inserted through vocal cords under direct vision, positive ETCO2 and breath sounds checked- equal and bilateral Secured at: 21 cm Tube secured with: Tape Dental Injury: Teeth and Oropharynx as per pre-operative assessment

## 2022-08-26 NOTE — H&P (Signed)
PREOPERATIVE H&P  HPI: Edward Griffith is a 31 y.o. male who has presented today for surgery, with the diagnosis of right shoulder retained hook plate s/p distal clavicle ORIF.  At this point his fracture is healed clinically and radiographically, and he requires hardware removal.  The various methods of treatment have been discussed with the patient and family.  After consideration of risks, benefits, and other options for treatment, the patient has consented to RIGHT SHOULDER HARDWARE REMOVAL as a surgical intervention.  The patient's history has been reviewed, patient examined, no change in status, stable for surgery.  I have reviewed the patient's chart and labs.  Questions were answered to the patient's satisfaction.    PMH: Past Medical History:  Diagnosis Date   ADHD (attention deficit hyperactivity disorder)    Asthma    as a child   Substance abuse (HCC)     Home Medications Allergies  No current facility-administered medications on file prior to encounter.   Current Outpatient Medications on File Prior to Encounter  Medication Sig Dispense Refill   diphenhydramine-acetaminophen (TYLENOL PM) 25-500 MG TABS tablet Take 1 tablet by mouth at bedtime as needed.     EPINEPHrine 0.3 mg/0.3 mL IJ SOAJ injection Inject 0.3 mg into the muscle as needed for anaphylaxis.     acetaminophen (TYLENOL) 500 MG tablet Take 1,000 mg by mouth every 6 (six) hours as needed for moderate pain.     promethazine (PHENERGAN) 12.5 MG tablet Take 1 tablet (12.5 mg total) by mouth every 6 (six) hours as needed for nausea or vomiting. (Patient not taking: Reported on 08/21/2022) 30 tablet 0   Allergies  Allergen Reactions   Atomoxetine Hcl Other (See Comments)    Psychosis   Peanut Oil Swelling    Swelling of Throat and Hives   Peanut-Containing Drug Products Anaphylaxis   Aripiprazole Other (See Comments)   Atomoxetine Other (See Comments)   Fruit Extracts     Itchy mouth   Shrimp (Diagnostic)  Swelling    Throat swelling   Coconut (Cocos Nucifera) Rash   Soy Allergy Rash     PSH: Past Surgical History:  Procedure Laterality Date   ORIF CLAVICULAR FRACTURE Right 05/09/2022   Procedure: OPEN REDUCTION INTERNAL FIXATION (ORIF) RIGHT CLAVICULAR FRACTURE,;  Surgeon: [name], MD;  Location: [facility] OR;  Service: Orthopedics;  Laterality: Right;     Family History Social History  Family History  Problem Relation Age of Onset   Schizophrenia Mother    Alcohol abuse Father     Social History   Socioeconomic History   Marital status: Single    Spouse name: Not on file   Number of children: Not on file   Years of education: Not on file   Highest education level: Not on file  Occupational History   Not on file  Tobacco Use   Smoking status: Former    Packs/day: 0.15    Types: Cigarettes   Smokeless tobacco: Never  Vaping Use   Vaping Use: Every day  Substance and Sexual Activity   Alcohol use: Yes    Alcohol/week: 5.0 standard drinks of alcohol    Types: 5 Cans of beer per week   Drug use: Yes    Types: Cocaine    Comment: last use cocaine 3 weeks (as of 05/08/22)   Sexual activity: Not on file  Other Topics Concern   Not on file  Social History Narrative   Not on file   Social Determinants of  Health   Financial Resource Strain: Not on file  Food Insecurity: Not on file  Transportation Needs: Not on file  Physical Activity: Not on file  Stress: Not on file  Social Connections: Not on file     Review of Systems: MSK: As noted per HPI above GI: No current Nausea/vomiting ENT: Denies sore throat, epistaxis CV: Denies chest pain Resp: No current shortness of breath  Other than mentioned above, there are no Constitutional, Neurological, Psychiatric, ENT, Ophthalmological, Cardiovascular, Respiratory, GI, GU, Musculoskeletal, Integumentary, Lymphatic, Endocrine or Allergic issues.   Physical Examination: CV: Normal distal pulses Lungs: Unlabored  respirations RUE: Well-healed benign-appearing incision.  Only mild tenderness about the area of the previous fracture today, with some mild discomfort on cross body abduction.  Shoulder range of motion is well-preserved and unchanged from examination in clinic.  He does have some discomfort with Neer, Jobe, and Hawkins test.  5/5 supraspinatus, 5/5 infraspinatus, 5/5 subscapularis isolation strength.  He is able to demonstrate intact APB, EDC, FDP index and small finger, and dorsal interossei function.  Patient tact light touch distally in median, radial, and ulnar distributions.  Normal radial pulse.  Warm and well-perfused distally.  Assessment/Plan: RIGHT SHOULDER HARDWARE REMOVAL    [name] M.D. Orthopaedic Surgery [facility]  Review of this patient's medications prescribed by other providers does not in any way constitute an endorsement by this clinician of their use, indications, dosage, route, efficacy, interactions, or other clinical parameters.  Portions of the record have been created with voice recognition software.  Grammatical and punctuation errors, random word insertions, wrong-word or "sound-a-like" substitutions, pronoun errors (inaccuracies and/or substitutions), and/or incomplete sentences may have occurred due to the inherent limitations of voice recognition software.  Not all errors are caught or corrected.  Although every attempt is made to root out erroneous and incomplete transcription, the note may still not fully represent the intent or opinion of the author.  Read the chart carefully and recognize, using context, where errors/substitutions have occurred.  Any questions or concerns about the content of this note or information contained within the body of this dictation should be addressed directly with the author for clarification.

## 2022-08-26 NOTE — Anesthesia Postprocedure Evaluation (Signed)
Anesthesia Post Note  Patient: Edward Griffith  Procedure(s) Performed: RIGHT SHOULDER HARDWARE REMOVAL (Right: Shoulder)     Patient location during evaluation: PACU Anesthesia Type: General Level of consciousness: awake and alert Pain management: pain level controlled Vital Signs Assessment: post-procedure vital signs reviewed and stable Respiratory status: spontaneous breathing, nonlabored ventilation, respiratory function stable and patient connected to nasal cannula oxygen Cardiovascular status: blood pressure returned to baseline and stable Postop Assessment: no apparent nausea or vomiting Anesthetic complications: no  No notable events documented.  Last Vitals:  Vitals:   08/26/22 1324 08/26/22 1330  BP: 120/71 116/72  Pulse: 69 70  Resp: 11 11  Temp:    SpO2: 97% 96%    Last Pain:  Vitals:   08/26/22 1315  TempSrc:   PainSc: 2                  Apolonio Cutting L Ruby Dilone

## 2022-08-26 NOTE — Transfer of Care (Signed)
Immediate Anesthesia Transfer of Care Note  Patient: Edward Griffith  Procedure(s) Performed: RIGHT SHOULDER HARDWARE REMOVAL (Right: Shoulder)  Patient Location: PACU  Anesthesia Type:General  Level of Consciousness: drowsy and patient cooperative  Airway & Oxygen Therapy: Patient Spontanous Breathing  Post-op Assessment: Report given to RN and Post -op Vital signs reviewed and stable  Post vital signs: Reviewed and stable  Last Vitals:  Vitals Value Taken Time  BP    Temp 36.9 C 08/26/22 1309  Pulse 76 08/26/22 1311  Resp 12 08/26/22 1311  SpO2 95 % 08/26/22 1311  Vitals shown include unvalidated device data.  Last Pain:  Vitals:   08/26/22 0751  TempSrc:   PainSc: 2          Complications: No notable events documented.

## 2022-08-26 NOTE — Discharge Instructions (Signed)
Discharge instructions for Dr. Shiraz Bastyr, M.D.: Please refer to the two-sided discharge instructions paper that Dr. Tamiki Kuba placed in the patient's paper chart. Please give this to the patient to take home after reviewing with the patient!!   General discharge instructions:  PLEASE REFER TO TWO-SIDED PAPER INSTRUCTIONS IN PAPER CHART FOR SPECIFIC INSTRUCTIONS!!!  Diet: As you were doing prior to hospitalization. Shower:  Unless otherwise specified (i.e. on two-sided paper instructions with paper chart) may shower but keep the wounds dry, use an occlusive plastic wrap, NO SOAKING IN TUB.  If the bandage gets wet, change with a clean dry gauze. Dressing:  Unless otherwise specified (i.e. on two-sided paper instructions with paper chart), may change your dressing 3-5 days after surgery.  Then change the dressing daily with sterile gauze dressing.  If there are sticky tapes (steri-strips) on your wounds and all the stitches are absorbable.  Leave the steri-strips in place when changing your dressings, they will peel off with time, usually 2-3 weeks. Activity:  Increase activity slowly as tolerated, but follow the restrictions on the two-sided paper discharge instructions sheet that Dr. Alister Staver placed in the paper chart.  No lifting or driving for 6 weeks. Weight Bearing: WEIGHTBEARING AS TOLERATED (WBAT) on the RIGHT UPPER EXTREMITY. To prevent constipation: You may use over-the-counter stool softener(s) such as Colace (over the counter) 100 mg by mouth twice a day and/or Miralax (over the counter) for constipation as needed.  Drink plenty of fluids (prune juice may be helpful) and high fiber foods.  Itching:  If you experience itching with your medications, try taking only a single pain pill, or even half a pain pill at a time.  You can also use benadryl over the counter for itching or also to help with sleep.  Precautions:  If you experience chest pain or shortness of breath - call 911 immediately for  transfer to the hospital emergency department!!  PLEASE REFER TO TWO-SIDED PAPER INSTRUCTIONS IN PAPER CHART FOR SPECIFIC INSTRUCTIONS!!!  If you develop a fever greater that 101.1 deg F, purulent drainage from wound, increased redness or drainage from wound, or calf pain -- Call the office at [phone].  

## 2022-08-27 ENCOUNTER — Encounter (HOSPITAL_COMMUNITY): Payer: Self-pay | Admitting: Orthopedic Surgery

## 2023-03-24 ENCOUNTER — Other Ambulatory Visit (INDEPENDENT_AMBULATORY_CARE_PROVIDER_SITE_OTHER)
Admission: EM | Admit: 2023-03-24 | Discharge: 2023-03-24 | Disposition: A | Discharge status: Home / Self Care | Payer: Medicare Other | Source: Home / Self Care | Admitting: Registered Nurse

## 2023-03-24 ENCOUNTER — Ambulatory Visit (HOSPITAL_COMMUNITY)
Admission: EM | Admit: 2023-03-24 | Discharge: 2023-03-24 | Disposition: A | Discharge status: Other Acute Inpatient Hospital | Payer: Medicare Other | Attending: Registered Nurse | Admitting: Registered Nurse

## 2023-03-24 ENCOUNTER — Encounter (HOSPITAL_COMMUNITY): Payer: Self-pay | Admitting: Registered Nurse

## 2023-03-24 DIAGNOSIS — F1020 Alcohol dependence, uncomplicated: Secondary | ICD-10-CM

## 2023-03-24 DIAGNOSIS — F10239 Alcohol dependence with withdrawal, unspecified: Secondary | ICD-10-CM | POA: Insufficient documentation

## 2023-03-24 DIAGNOSIS — F32A Depression, unspecified: Secondary | ICD-10-CM | POA: Insufficient documentation

## 2023-03-24 DIAGNOSIS — Z8659 Personal history of other mental and behavioral disorders: Secondary | ICD-10-CM

## 2023-03-24 DIAGNOSIS — F4310 Post-traumatic stress disorder, unspecified: Secondary | ICD-10-CM | POA: Insufficient documentation

## 2023-03-24 DIAGNOSIS — F1029 Alcohol dependence with unspecified alcohol-induced disorder: Secondary | ICD-10-CM | POA: Insufficient documentation

## 2023-03-24 DIAGNOSIS — F1094 Alcohol use, unspecified with alcohol-induced mood disorder: Secondary | ICD-10-CM | POA: Diagnosis present

## 2023-03-24 DIAGNOSIS — F1910 Other psychoactive substance abuse, uncomplicated: Secondary | ICD-10-CM | POA: Insufficient documentation

## 2023-03-24 DIAGNOSIS — F1024 Alcohol dependence with alcohol-induced mood disorder: Secondary | ICD-10-CM | POA: Diagnosis not present

## 2023-03-24 DIAGNOSIS — F1019 Alcohol abuse with unspecified alcohol-induced disorder: Secondary | ICD-10-CM

## 2023-03-24 DIAGNOSIS — F840 Autistic disorder: Secondary | ICD-10-CM | POA: Insufficient documentation

## 2023-03-24 DIAGNOSIS — F4320 Adjustment disorder, unspecified: Secondary | ICD-10-CM | POA: Insufficient documentation

## 2023-03-24 DIAGNOSIS — F909 Attention-deficit hyperactivity disorder, unspecified type: Secondary | ICD-10-CM | POA: Insufficient documentation

## 2023-03-24 LAB — POCT URINE DRUG SCREEN - MANUAL ENTRY (I-SCREEN)
POC Amphetamine UR: NOT DETECTED
POC Buprenorphine (BUP): NOT DETECTED
POC Cocaine UR: NOT DETECTED
POC Marijuana UR: NOT DETECTED
POC Methadone UR: NOT DETECTED
POC Methamphetamine UR: NOT DETECTED
POC Morphine: NOT DETECTED
POC Oxazepam (BZO): NOT DETECTED
POC Oxycodone UR: NOT DETECTED
POC Secobarbital (BAR): NOT DETECTED

## 2023-03-24 LAB — URINALYSIS, ROUTINE W REFLEX MICROSCOPIC
Bilirubin Urine: NEGATIVE
Glucose, UA: NEGATIVE mg/dL
Hgb urine dipstick: NEGATIVE
Ketones, ur: NEGATIVE mg/dL
Leukocytes,Ua: NEGATIVE
Nitrite: NEGATIVE
Protein, ur: NEGATIVE mg/dL
Specific Gravity, Urine: 1.008 (ref 1.005–1.030)
pH: 6 (ref 5.0–8.0)

## 2023-03-24 LAB — COMPREHENSIVE METABOLIC PANEL
ALT: 20 U/L (ref 0–44)
AST: 28 U/L (ref 15–41)
Albumin: 4.2 g/dL (ref 3.5–5.0)
Alkaline Phosphatase: 69 U/L (ref 38–126)
Anion gap: 12 (ref 5–15)
BUN: 6 mg/dL (ref 6–20)
CO2: 22 mmol/L (ref 22–32)
Calcium: 8.4 mg/dL — ABNORMAL LOW (ref 8.9–10.3)
Chloride: 103 mmol/L (ref 98–111)
Creatinine, Ser: 0.86 mg/dL (ref 0.61–1.24)
GFR, Estimated: >60 mL/min (ref >60)
Glucose, Bld: 83 mg/dL (ref 70–99)
Potassium: 3.6 mmol/L (ref 3.5–5.1)
Sodium: 137 mmol/L (ref 135–145)
Total Bilirubin: 0.5 mg/dL (ref 0.3–1.2)
Total Protein: 7.9 g/dL (ref 6.5–8.1)

## 2023-03-24 LAB — TSH: TSH: 1.081 u[IU]/mL (ref 0.350–4.500)

## 2023-03-24 LAB — ETHANOL: Alcohol, Ethyl (B): 218 mg/dL — ABNORMAL HIGH (ref <10)

## 2023-03-24 LAB — CBC WITH DIFFERENTIAL/PLATELET
Abs Immature Granulocytes: 0 10*3/uL (ref 0.00–0.07)
Basophils Absolute: 0.1 10*3/uL (ref 0.0–0.1)
Basophils Relative: 1 %
Eosinophils Absolute: 0.3 10*3/uL (ref 0.0–0.5)
Eosinophils Relative: 7 %
HCT: 41.9 % (ref 39.0–52.0)
Hemoglobin: 14.5 g/dL (ref 13.0–17.0)
Immature Granulocytes: 0 %
Lymphocytes Relative: 41 %
Lymphs Abs: 2.1 10*3/uL (ref 0.7–4.0)
MCH: 32.7 pg (ref 26.0–34.0)
MCHC: 34.6 g/dL (ref 30.0–36.0)
MCV: 94.6 fL (ref 80.0–100.0)
Monocytes Absolute: 0.6 10*3/uL (ref 0.1–1.0)
Monocytes Relative: 11 %
Neutro Abs: 2 10*3/uL (ref 1.7–7.7)
Neutrophils Relative %: 40 %
Platelets: 345 10*3/uL (ref 150–400)
RBC: 4.43 MIL/uL (ref 4.22–5.81)
RDW: 11.7 % (ref 11.5–15.5)
WBC: 5 10*3/uL (ref 4.0–10.5)
nRBC: 0 % (ref 0.0–0.2)

## 2023-03-24 LAB — LIPID PANEL
Cholesterol: 207 mg/dL — ABNORMAL HIGH (ref 0–200)
HDL: 83 mg/dL (ref >40)
LDL Cholesterol: 116 mg/dL — ABNORMAL HIGH (ref 0–99)
Total CHOL/HDL Ratio: 2.5 RATIO
Triglycerides: 39 mg/dL (ref <150)
VLDL: 8 mg/dL (ref 0–40)

## 2023-03-24 LAB — HEMOGLOBIN A1C
Hgb A1c MFr Bld: 4.2 % — ABNORMAL LOW (ref 4.8–5.6)
Mean Plasma Glucose: 73.84 mg/dL

## 2023-03-24 LAB — MAGNESIUM: Magnesium: 1.9 mg/dL (ref 1.7–2.4)

## 2023-03-24 MED ORDER — CHLORDIAZEPOXIDE HCL 25 MG PO CAPS: 25.0000 mg | ORAL_CAPSULE | Freq: Four times a day (QID) | ORAL | Status: DC | PRN

## 2023-03-24 MED ORDER — ONDANSETRON 4 MG PO TBDP: 4.0000 mg | ORAL_TABLET | Freq: Four times a day (QID) | ORAL | Status: DC | PRN

## 2023-03-24 MED ORDER — ACETAMINOPHEN 325 MG PO TABS: 650.0000 mg | ORAL_TABLET | Freq: Four times a day (QID) | ORAL | Status: DC | PRN

## 2023-03-24 MED ORDER — ALUM & MAG HYDROXIDE-SIMETH 200-200-20 MG/5ML PO SUSP: 30.0000 mL | ORAL | Status: DC | PRN

## 2023-03-24 MED ORDER — CHLORDIAZEPOXIDE HCL 25 MG PO CAPS
25.0000 mg | ORAL_CAPSULE | Freq: Four times a day (QID) | ORAL | Status: DC
  Administered 2023-03-24: 25 mg via ORAL
  Filled 2023-03-24: qty 1

## 2023-03-24 MED ORDER — CHLORDIAZEPOXIDE HCL 25 MG PO CAPS: 25.0000 mg | ORAL_CAPSULE | ORAL | Status: DC

## 2023-03-24 MED ORDER — TRAZODONE HCL 50 MG PO TABS: 50.0000 mg | ORAL_TABLET | Freq: Every evening | ORAL | Status: DC | PRN

## 2023-03-24 MED ORDER — MAGNESIUM HYDROXIDE 400 MG/5ML PO SUSP: 30.0000 mL | Freq: Every day | ORAL | Status: DC | PRN

## 2023-03-24 MED ORDER — LOPERAMIDE HCL 2 MG PO CAPS: 2.0000 mg | ORAL_CAPSULE | ORAL | Status: DC | PRN

## 2023-03-24 MED ORDER — THIAMINE HCL 100 MG/ML IJ SOLN
100.0000 mg | Freq: Once | INTRAMUSCULAR | Status: AC
  Administered 2023-03-24: 100 mg via INTRAMUSCULAR
  Filled 2023-03-24: qty 2

## 2023-03-24 MED ORDER — HYDROXYZINE HCL 25 MG PO TABS: 25.0000 mg | ORAL_TABLET | Freq: Four times a day (QID) | ORAL | Status: DC | PRN

## 2023-03-24 MED ORDER — GABAPENTIN 100 MG PO CAPS
200.0000 mg | ORAL_CAPSULE | Freq: Three times a day (TID) | ORAL | Status: DC
  Administered 2023-03-24: 200 mg via ORAL
  Filled 2023-03-24: qty 2

## 2023-03-24 MED ORDER — ADULT MULTIVITAMIN W/MINERALS CH
1.0000 | ORAL_TABLET | Freq: Every day | ORAL | Status: DC
  Administered 2023-03-24: 1 via ORAL
  Filled 2023-03-24: qty 1

## 2023-03-24 MED ORDER — CHLORDIAZEPOXIDE HCL 25 MG PO CAPS: 25.0000 mg | ORAL_CAPSULE | Freq: Three times a day (TID) | ORAL | Status: DC

## 2023-03-24 MED ORDER — CHLORDIAZEPOXIDE HCL 25 MG PO CAPS: 25.0000 mg | ORAL_CAPSULE | Freq: Every day | ORAL | Status: DC

## 2023-03-24 NOTE — ED Notes (Signed)
Pt has a legal guardian, his mother, Genaro Melhado [phone]

## 2023-03-24 NOTE — ED Notes (Signed)
Skin search completed with  Amy MHT, and this Clinical research associate. No contraband noted. Small abrasions to right hand on knuckles with small cuts to skin. EKG completed. Pt given large cup of orange juice. Blood drawn and sent to lab. Informed [facility] RN pt ready to admit. Pt to be transferred once group completed per request of [facility] RN.

## 2023-03-24 NOTE — Discharge Instructions (Signed)

## 2023-03-24 NOTE — Progress Notes (Signed)
   03/24/23 1452  [facility] Triage Screening (Walk-ins at [facility] only)  How Did You Hear About [place]? Family/Friend  What Is the Reason for Your Visit/Call Today? Pt presents to [facility] voluntarily accompanied by his parents seeking alcohol use treatment. Pt reports consuming about a fifth of liquor a day, last use was today an unknown amount. Pt states "I'm drunk now". Pt has slurred speech. Pt reports passive SI, with no clear plan. Pt states he was drinking with friends lastnight and they informed him that he made a statment asking them to kill him. Pt reports his drinking is triggered by his partner passing away 2 years ago and his close friend is moving away. Pt reports a hx of seizures when withdrawing from alcohol, he reports his last seizure was 1 week ago. Pt denies HI and AVH currently.  How Long Has This Been Causing You Problems? <Week  Have You Recently Had Any Thoughts About Hurting Yourself? Yes  How long ago did you have thoughts about hurting yourself? today  Are You Planning to Commit Suicide/Harm Yourself At This time? No  Have you Recently Had Thoughts About Hurting Someone Karolee Ohs? No  Are You Planning To Harm Someone At This Time? No  Are you currently experiencing any auditory, visual or other hallucinations? No  Have You Used Any Alcohol or Drugs in the Past 24 Hours? Yes  How long ago did you use Drugs or Alcohol? today  What Did You Use and How Much? fifth a day, last drink was this morning unknown amount  Do you have any current medical co-morbidities that require immediate attention? No  Clinician description of patient physical appearance/behavior: reports he is intoxicated, wearing shorts and t-shirt  What Do You Feel Would Help You the Most Today? Alcohol or Drug Use Treatment  If access to [facility]gent [facility] was not available, would you have sought care in the Emergency Department? No  Determination of Need Urgent (48 hours)  Options For Referral Outpatient Therapy;Medication  Management

## 2023-03-24 NOTE — ED Notes (Signed)
Pt is in the dayroom watching TV.  Patient denies SI and AVH. Patient reports he wants to hurt his mother emotionally. Patient reports he would like to follow-up with outpatient upon discharge. Patient is in a good mood reports he has drank about a half of a fifth of liquor. Patient states he has been given dinner and  his medication. Pt states he wants to  wait until 8pm to decide whether he wants to go home tonight or tomorrow to his girlfriend or parents. Patient is being monitored for safety.

## 2023-03-24 NOTE — ED Notes (Signed)
Patient was admitted to the [facility] unit.Patient completed the admission assessment. Patient was oriented to the unit. Patient denies SI and AVH. Patient reports he wants to hurt his mother emotionally. Patient reports he would like to follow-up with outpatient upon discharge.  Patient is in a good mood reports he has drank about a half of a fifth of liquor. Patient was given dinner and administered his medication. Patient is being monitored for safety.

## 2023-03-24 NOTE — Discharge Instructions (Signed)
Long Term (greater than 30 days) Residential Treatment Centers in [facility]  [place],?MW?[id number]   Phone: [phone] [facility] is a premier accredited?residential?mental health treatment facility for adults in [place], [place]. We offer a continuum of care that includes PHP and IOP in addition to?residential?services. Our holistic behavioral health home model focuses on complete psychiatric, medical and spiritual wellness with access to full psychiatric diagnostic and assessment services and psychological testing. In addition to traditional and group therapy (CBT based), we also offer integrative therapies including art, music, recreation, Clinical research associate, health & wellness, pet and pastoral care.  [facility]  [place],?[id number]   Phone: [phone] [facility] is a clinically dynamic, dual-diagnosis?residential?treatment program for adolescent males ages 47-17. Through a holistic approach, we help emerging young men address trauma, substance use, mental health issues, grief and loss, and adoption/divorce issues. Equine therapy, physical fitness, nutritional education, adventure and experiential therapies, and animal husbandry complement clinically intensive programming. An academic component is also included, giving clients the opportunity to stay on track with their schooling while enrolled at [employer]. To provide healing and growth for the entire family system, we incorporate an emotionally focused Family Therapy Program centered around improving parent-child relationships and communication skills for sustained recovery. [facility] also offers Recovery Management services based on the Gorski-CENAPS model of relapse prevention for clients who have experienced or are about to experience a return to use after roughly 90 days of sobriety.   [facility] and Adult [employer],?JO?[id number]   Phone: [phone] [facility] is a premier addiction rehabilitation center located in the [address] town of [place], [place]. We accept patients ages 58 and older who are struggling with symptoms of substance use disorders and certain co-occurring mental health concerns. We are dedicated to using an individualized approach to treatment and customize each patient's treatment plan according to their unique needs and goals. We offer several programming options to meet patients where they are on the journey to healing, including a partial hospitalization program (PHP) that supports patients who need structure without 24-hour supervision. Though many of our PHP patients begin in our detox or?residential?program, we have some patients who begin treatment at the [facility] level. This program typically lasts up to 28 days; however, the actual duration will depend on the personal needs of the patient.  [facility] [employer] and [employer],?NA?[address]573   Phone [phone]  By exploring foundational issues that precipitate substance use and other disorders, our programs give clients the resources to be successful in?long-term?recovery, become more self-sufficient, identify triggers that can lead to relapse, build healthy relationships, and learn positive coping skills.  [facility] has three clinically driven, trauma-focused programs that offer gender-specific care to young adult men ages 74-30 ([place]), young adult women ages 32-35 ([place]), and adolescent boys ages 69-17 ([place]). Our master's level clinicians, dually licensed to treat mental health and addiction, integrate research-supported practices with complementary modalities to help clients honor themselves, recognize self-worth, and pursue positive, lasting change. [facility] and [employer] generally provide primary substance use treatment, while the Edwena Felty can also  provide treatment for primary mental health. Our developmentally specific treatment methods consider clients' unique stories, trauma history, gender challenges, substance use history, relapse triggers, and mental health issues to allow for holistic treatment of the whole person. [facility] also offers Recovery Management services based on the Gorski-CENAPS model  of relapse prevention for clients who have experienced or are about to experience a return to use after roughly 90 days of sobriety.   [facility]  [place],?ZO?[id number]   Phone: [phone]  Omer Jack is a recognized leader for its effectiveness in twelve-step?residential?treatment, research-based recovery services and education for substance use and co-occurring disorders. Through an exceptional staff of clinical and medical professionals, we provide intensive?residential?and outpatient treatment for adults and young adults, extended care for professionals and women with trauma and persistent relapse issues, alumni support and family education services for adult men, women and children. Omer Jack is a Dentist and is fully accredited by [employer] on Optician, dispensing, [employer]. [facility],?YN?[id number]   Phone: [phone] We offer struggling individuals a chance at freedom from addiction and alcoholism through comprehensive treatment. If you need help, recovery is possible. At [facility], every individual is met with the compassion and the care that they need in order to truly heal. Our physicians and psychologists work hand in hand to come up with personalized treatment strategies which are meant to address individualized needs. These strategies are available in both outpatient and?residential?aspects, depending on an individual's history of addiction and day-to-day responsibilities. Recovery from addiction is challenging. With the help and  support from compassionate case members, therapists, and staff within the recovery community here at [facility], individuals struggling with addiction have a chance at a new beginning.   [facility]idential  [place],?ON?[address]   Phone: [phone], located in [place], [place], provides?residential?eating disorder treatment for people who are struggling with anorexia, bulimia, and other eating disorders. We also offer comprehensive treatment for people who have co-occurring mental health concerns. We provide care at two locations: Our first facility is designed for women ages 29 and older, and our second facility serves adults ages 79 and older of all genders. The goal of both?residential?treatment programs is to help people find healing and lasting freedom from the burdens of eating disorders. The average length of stay at this level of care is 30-60 days, but each person's actual length of stay depends on their unique needs. Treatment at our facility incorporates a number of therapies, including dialectical behavior therapy (DBT), exposure and response prevention (ERP), motivational interviewing (MI), and cognitive behavioral therapy (CBT). While in treatment, clients can also benefit from medical care, medication management services, and individual, group, family, and experiential therapies.   [facility]:  Treatment Center, MS, [facility], LCAS, CCS  [place],?UV?[address]   Phone: [phone] [facility] provides more than just transitional living homes. We are a supportive community of peers and addictions professionals committed to substance abuse recovery. Our safe, close-knit community and highly structured programs ensure our residents have the best support possible for a successful and?long-term?recovery. Now offering an Intensive Outpatient (IOP) program available to the [place] community. [facility] also is a MAT friendly  program and supports men across mental health and substance use disorders.   [facility]  [place],?DG?[address]   Phone: [phone] Our healing mission is to improve the lives of individuals impeded by mental health challenges or mental illness to achieve their highest levels of functioning and fulfillment. [facility]' progressive?residential?healing communities and transitional living program are located in the [address] of [place] in [place] and [place]. We offer hope and healing to adults who struggle with mental health challenges. Our approach is not based  on "quick fixes" but grounded in research and the proven healing power of community in a?residential?setting. Our philosophy: mind and heart working together. Simply put, we believe that whole person care, delivered in a community setting is the most powerful and effective way to address challenging mental health issues. With an integrated system of care, [facility] offers six levels of treatment, The [facility] at [facility] (residential?treatment), [facility] at [employer] (residential?treatment), Extended Care at the [facility] (semi-transitional), The [employer] (transitional living with two levels of support), and a Partial Hospitalization Program or "PHP" (outpatient care).  [facility]  [facility] [employer], MS, LPC, LCAS, CCS  [place],?[id number]   Phone: [phone]    [phone] Located in heart of the beautiful [facility], [facility] provides an Intensive Outpatient Program known as IOP to [facility] [place], [address] with the support of on-site certified & licensed professionals. [facility] serves men between 2 and 35 years old who are striving for Recovery, have completed in-patient treatment such as?residential?treatment or medical detox, have 30 days clean and sober, or for men  whose outpatient treatment services were not effective. In another program offering, [facility] is integrated with [facility] [place]'s Transitional Living Program to provide a unique Extended Care Recovery Treatment model. This program offers an array of high-quality recovery services and programs intertwined with Recovery Home and Sober Living Environments. Since 2006, [facility] has been providing high-quality programs and is known as one of the best programs around today offering Relapse Prevention, Life skills Classes, 12-step support, Outdoor Adventure Therapy, & so much more. We are loving, committed, structured, and are here to help you gain back integrity, strength, and self-worth.   [facility],?YQ?[id number]   Phone: [phone] [facility] offers inpatient,?residential, partial hospitalization and intensive outpatient programs (PHP/IOP), and outpatient sessions in [place] and [place].  [facility] offers specialty treatment for eating disorders dedicated to giving all people access to best-practice care and the tools they need for lasting recovery. Re[facility] offers all levels of care, from 24/7 inpatient care through outpatient services across multiple states. In-person and virtual treatment options are available. Multidisciplinary treatment teams aim to equip individuals, families, and communities with the skills necessary to continue recovery in the home environment. Our admissions team is available to help you, your loved one, or your client get started Monday through Thursday from 8:00 AM- 8:00 PM ET, Fridays 8:00 AM-7:30 PM ET, and Saturdays and Sundays from 10:00 AM-6:00 PM. [facility] has three locations in [place], [place] with additional locations in [place], [place], and [place]. Our [place] locations are [place] for IP/RES ages 4-18, [place] for IP/RES ages 61 and up, and [place] for  PHP/IOP/OP for ages 18 and up. Click the "Nearby Areas" button on the right to find a location near you.  [facility] Treatment Center, LCAS, [place]  [place],?LK?[id number]   Phone: [phone] We give each person the best chance at?long term?recovery with our many services offered here.  We are a small intimate facility treating substance abuse with, or without, mental health disorders. We pride ourselves in our staff's dedication to recovery and many years of experience in the addiction and mental health fields. [facility] is dedicated to helping those wishing to overcome substance abuse issues.   [employer],?HK?[address]   Phone: [phone] Teens in our outpatient and?residential?programs build self-compassion, resilience, healthy coping skills, and  more trusting relationships with parents and peers.  At [facility], we know that treatment financing can make all the difference in receiving quality treatment. To that end, we promote collaborative relationships with all healthcare payers to optimize the benefit coverage for families in need. We accept all major insurance, and up to 100 percent of our services are covered. Our goal is to partner with insurers to address the adolescent and young adult mental health crisis, by reaching more families and helping them achieve sustainable recovery. If you're interested in exploring the possibility of treatment at [facility] for your loved one's mental health, behavioral health, or substance abuse issues, we can begin the insurance verification process immediately. Furthermore, we are happy to obtain your insurance policy information and seek verification on your behalf. You can also expedite this process by completing the insurance verification form on our website. There is no obligation to either [employer] or to your insurance provider.   [facility], treatment Center, CCS, LCAS, LCSW, CSAC   [place],?ZO?[address]   Phone: [phone] [facility] is [place]'s [facility]. At [facility] we offer multiple levels of addiction & alcoholism treatment programs, each tailored to the clients' specific issues and needs. All of our programs are designed to help you gain insight into the disease of addiction while acquiring the life skills needed to sustain?long-term?recovery. [facility]'s professional, experienced, and caring staff develops individualized treatment plans for each patient. Therapies that will help every individual learn to manage their behavioral health problems. The path to sobriety requires patience and dedication on behalf of the addict and their family. The decision to enter rehab is an important one, and we at [facility] understand and respect that. Our drug rehab program focuses on changing the patterns and daily habits that naturally develop when a loved one is addicted to drugs. Our therapists will also address the underlying psychological issues that may have caused the drug abuse in the first place. Clients will receive individual and necessary therapeutic attention while working on and replacing bad habits with good ones and creating a healthy lifestyle.  [facility] [employer] [facility]  [place],?YN?[id number]   Phone: [phone] Our?residential?program provides age-appropriate addiction treatment for youths ages 30-17.  Adolescents' bodies and brains are still developing, so when young people struggle with addictions, they can be negatively impacted in unique ways. Adolescents who have substance use disorders can experience impaired brain development, academic problems, and struggles with interpersonal relationships. They can also be at risk for developing mental health concerns. At [facility], we believe that addressing mental health concerns and addictions together through dual diagnosis treatment can lead to the  most optimal results for young people. We offer three dual diagnosis programs that are organized by gender and age group. Our [employer] serves youths ages 88-12 of all genders who are suffering from mental health concerns and co-occurring addictions. We also have?residential?programs for teen girls and boys ages 44-17 who need dual diagnosis treatment.   [facility],?ON?[address]   Phone: [phone] [facility] is a nationally recognized behavioral healthcare provider. Our psychiatric?residential?treatment facility (PRTF) provides comprehensive?residential?treatment to youth ages 25 to 23. Our facility is fully accredited by the Joint Commission and our programming offers Trauma Focused Evidence Based practices that produce improved outcomes for those we serve. Our programs offer treatment to those with a history of: sexually harmful behaviors, trauma, self-injury, failed attempts at lower levels of  care, repeat stays in acute inpatient settings, aggression and violence towards others. This setting also operates a fully accredited private school and offer medical/dental care and on-site recreational therapy services.   [facility] of [place],?NF?[address]   Phone: [phone] [facility] has been the pioneer in the treatment of eating disorders since 1985. As the nation's first?residential?eating disorder facility, now with 19 locations throughout the country, [facility] has helped more than 100,000 cisgender adolescent girls and adult women, transgender and non-binary individuals with eating disorders move towards recovery. [facility] provides women suffering from anorexia nervosa, bulimia nervosa, binge eating disorder, and related mental health problems with the tools they need to succeed in recovery and in life. [facility]'s extensive range of services includes?residential, day treatment, intensive outpatient, and outpatient programs. Each treatment  level is built upon [employer] for Eating Disorders, an evidence-based, emotion-focused therapy that addresses eating disorders and co-morbid symptoms. Within this model, individual and group therapy are enhanced with a diverse array of services to meet patients' needs. [facility] accepts most major insurances and is a preferred provider for all levels of treatment.   [facility] psychotherapist for [employer],?XB?[address]   Phone: [phone] [facility]idential?[facility] Centers offers the highest caliber of evidence-based care in the nation for adolescents 12-17 struggling with mental health and addiction issues. We specialize in teens battling depression, anxiety, trauma, emotion dysregulation, high-risk/self-harm behaviors, Oppositional Defiant Disorder, ADHD, addiction, suicidal ideation, and other emotional/behavioral issues. [facility] treats teens--and teens only. Our treatment approach emphasizes Dialectical Behavior Therapy (DBT) and Cognitive Behavior Therapy,?along with other evidence-based modalities such as Seeking Safety (trauma), Relapse?Prevention, Behavioral?Activation, Motivational Interviewing, Mindfulness-Based Cognitive Therapy, and 12-Step support programs.?Our goal is genuine recovery that lasts long after your child leaves treatment.?[facility]'s?robust?residential?program includes individual and family therapy, psychiatry, group therapy, experiential therapies (e.g.,?equine, surf, art, music, drama, yoga, etc.) and 24/7 skills-coaching. For out-of-state families, we offer family therapy via HIPAA-compliant video conferencing. We also provide daily academic support to keep teens on track with school.   [facility] Dual Diagnosis Treatment  [place],?DG?[id number]   Phone: [phone] Only serving?up to six?teens at any given time, [facility]'s?residential?programs are suitable?for teens stepping down from inpatient hospitalization, or as a step-up  option for teens requiring?a higher level of care?than partial hospitalization or intensive outpatient programs.  At?[facility] Centers, we?provide the highest caliber of evidence-based treatment?for?teens ages 14-17 struggling with depression, anxiety,?substance abuse, dual diagnosis, ADHD,?self-harming?or high-risk behaviors, suicidality,?Oppositional Defiant Disorder, and?other mental health/behavioral issues.? Our primary therapeutic modalities include Dialectical Behavior Therapy?(DBT) and Cognitive Behavioral Therapy. Teens participate in?individual?and?family therapy at least 5 times a week; weekly psychiatry sessions;?daily groups?(on?DBT?Skills Training, Anger Management, Seeking Safety, and Relapse Prevention); and daily academics. For families from out-of-state, we conduct family therapy remotely.?At [facility], our goal is compassionate, high-quality treatment that will last long after your teen leaves our program. We offer daily?experiential activities?proven to?support?recovery (e.g.?equine therapy, surf, art, drama,?music, yoga,?etc.)?and 12-Step/SMART Recovery?programs for?those?struggling with dual diagnoses. Our?emphasis?on safety and individualized attention are reasons why so many families from all over the country choose [facility].   [facility] Phone: [phone]  Toll-free Phone: [phone] Email: [email or web address] .Antoine Poche is an innovative, multi-year residential program that empowers people with substance use disorders to be productive, recovering individuals by providing comprehensive treatment, experiential vocational training, education, and continuing care. The primary requirement for admission to the [facility] program is that the individual must have a substance use disorder and desire a long-term residential treatment program. Burnett Harry is a voluntary  program, and we require that the individual seeking admission must call or write to [place] directly and participate in a  telephone interview.  While we welcome phone calls from family members and friends to learn more, Burnett Harry is a Building surveyor, and it is important that the individual seeking treatment begins the recovery process by calling for themself.  Resident Eligibility: Be 18 years or older, have a substance use disorder and desire a multi-year residential program, Able to fully participate in [facility]'s therapeutic program.

## 2023-03-24 NOTE — ED Notes (Signed)
PT REQUESTING TO BE DISCHARGED 

## 2023-03-24 NOTE — ED Provider Notes (Signed)
FBC/OBS ASAP Discharge Summary  Date and Time: 03/24/2023 8:52 PM  Name: Edward Griffith  MRN:  [id number]   Discharge Diagnoses:  Final diagnoses:  Alcohol abuse with alcohol-induced disorder (HCC)  Alcohol use disorder, severe, dependence (HCC)  Autism spectrum disorder  History of posttraumatic stress disorder (PTSD)    Subjective: "Edward Griffith 32 y.o. male patient presented to [facility] as a walk in accompanied by his parents with complaints of alcohol abuse, worsening depression, and assistance. Patient reports he was intoxicated yesterday "and my brother told me I asked him to kill me.  I knew then I had to get help."  Patient denies suicidal ideation at this time".    Stay Summary: Patient admitted today 03/24/23 @ 1606 pm for alcohol detoxal.  Patient now requesting to leave AMA, stating  " I need to get out of here, if I keep myself here any longer, I'm gonna go crazy. The alcohol has worn off. My friends were concerned about me last night, I have a big drinking problem, I drink 1 bottle a day. I know about AA and outpatient substance abuse treatment programs, which  I prefer to this place. When it comes to staying here, I'm gonna be a danger to myself or others. I'll get bored and antsy, and I'll start pacing, and it'll go downhill from there, I just wanna go home to my parents.  Patient provided with verbal encouragement to stay and continue his treatment. The patient is provided education on the benefits of staying versus risk of living without completed treatment.  Patient acknowledges understanding of medication provided. Patient insists on discharging AMA, stating he will follow up with [facility] and AA.  On evaluation, patient is alert, oriented x 4, and cooperative. Speech is clear, normal rate and coherent. Pt appears casual. Eye contact is good. Mood is euthymic, affect is congruent with mood. Thought process is coherent and thought content is WDL. Pt denies SI/HI/AVH. There is no  objective indication that the patient is responding to internal stimuli. No delusions elicited during this assessment.    Total Time spent with patient: 15 minutes  Past Psychiatric History: See Chart Tobacco Cessation:  Patient refused  Current Medications:  Current Facility-Administered Medications  Medication Dose Route Frequency Provider Last Rate Last Admin   acetaminophen (TYLENOL) tablet 650 mg  650 mg Oral Q6H PRN Rankin, Shuvon B, NP       alum & mag hydroxide-simeth (MAALOX/MYLANTA) 200-200-20 MG/5ML suspension 30 mL  30 mL Oral Q4H PRN Rankin, Shuvon B, NP       chlordiazePOXIDE (LIBRIUM) capsule 25 mg  25 mg Oral Q6H PRN Rankin, Shuvon B, NP       chlordiazePOXIDE (LIBRIUM) capsule 25 mg  25 mg Oral QID Rankin, Shuvon B, NP   25 mg at 03/24/23 1818   Followed by   Melene Muller ON 03/25/2023] chlordiazePOXIDE (LIBRIUM) capsule 25 mg  25 mg Oral TID Rankin, Shuvon B, NP       Followed by   Melene Muller ON 03/26/2023] chlordiazePOXIDE (LIBRIUM) capsule 25 mg  25 mg Oral BH-qamhs Rankin, Shuvon B, NP       Followed by   Melene Muller ON 03/27/2023] chlordiazePOXIDE (LIBRIUM) capsule 25 mg  25 mg Oral Daily Rankin, Shuvon B, NP       gabapentin (NEURONTIN) capsule 200 mg  200 mg Oral TID Rankin, Shuvon B, NP   200 mg at 03/24/23 1823   hydrOXYzine (ATARAX) tablet 25 mg  25 mg Oral  Q6H PRN Rankin, Shuvon B, NP       loperamide (IMODIUM) capsule 2-4 mg  2-4 mg Oral PRN Rankin, Shuvon B, NP       magnesium hydroxide (MILK OF MAGNESIA) suspension 30 mL  30 mL Oral Daily PRN Rankin, Shuvon B, NP       multivitamin with minerals tablet 1 tablet  1 tablet Oral Daily Rankin, Shuvon B, NP   1 tablet at 03/24/23 1818   ondansetron (ZOFRAN-ODT) disintegrating tablet 4 mg  4 mg Oral Q6H PRN Rankin, Shuvon B, NP       traZODone (DESYREL) tablet 50 mg  50 mg Oral QHS PRN Rankin, Shuvon B, NP       No current outpatient medications on file.    PTA Medications:  Facility Ordered Medications  Medication    acetaminophen (TYLENOL) tablet 650 mg   alum & mag hydroxide-simeth (MAALOX/MYLANTA) 200-200-20 MG/5ML suspension 30 mL   magnesium hydroxide (MILK OF MAGNESIA) suspension 30 mL   traZODone (DESYREL) tablet 50 mg   gabapentin (NEURONTIN) capsule 200 mg   [COMPLETED] thiamine (VITAMIN B1) injection 100 mg   multivitamin with minerals tablet 1 tablet   chlordiazePOXIDE (LIBRIUM) capsule 25 mg   hydrOXYzine (ATARAX) tablet 25 mg   loperamide (IMODIUM) capsule 2-4 mg   ondansetron (ZOFRAN-ODT) disintegrating tablet 4 mg   chlordiazePOXIDE (LIBRIUM) capsule 25 mg   Followed by   Melene Muller ON 03/25/2023] chlordiazePOXIDE (LIBRIUM) capsule 25 mg   Followed by   Melene Muller ON 03/26/2023] chlordiazePOXIDE (LIBRIUM) capsule 25 mg   Followed by   Melene Muller ON 03/27/2023] chlordiazePOXIDE (LIBRIUM) capsule 25 mg       03/24/2023    4:37 PM 10/01/2016    5:22 PM 09/17/2016    9:45 AM  Depression screen PHQ 2/9  Decreased Interest 3 2 2   Down, Depressed, Hopeless 3 1 1   PHQ - 2 Score 6 3 3   Altered sleeping 3 2 3   Tired, decreased energy 3 1 1   Change in appetite 3 2 2   Feeling bad or failure about yourself  3 1 1   Trouble concentrating 3 2 2   Moving slowly or fidgety/restless 3 1 2   Suicidal thoughts 1 1 0  PHQ-9 Score 25 13 14   Difficult doing work/chores Somewhat difficult Somewhat difficult Somewhat difficult    Flowsheet Row ED from 03/24/2023 in [facility] Most recent reading at 03/24/2023  4:37 PM ED from 03/24/2023 in [facility] Most recent reading at 03/24/2023  3:23 PM Admission (Discharged) from 08/26/2022 in [place] [facility] Most recent reading at 08/26/2022  7:54 AM  C-SSRS RISK CATEGORY No Risk Low Risk No Risk       Musculoskeletal  Strength & Muscle Tone: within normal limits Gait & Station: normal Patient leans: N/A  Psychiatric Specialty Exam  Presentation  General Appearance:  Appropriate for  Environment  Eye Contact: Good  Speech: Clear and Coherent  Speech Volume: Normal  Handedness: Right   Mood and Affect  Mood: Euthymic  Affect: Congruent   Thought Process  Thought Processes: Coherent  Descriptions of Associations:Intact  Orientation:Full (Time, Place and Person)  Thought Content:WDL     Hallucinations:Hallucinations: None  Ideas of Reference:None  Suicidal Thoughts:Suicidal Thoughts: No  Homicidal Thoughts:Homicidal Thoughts: No   Sensorium  Memory: Immediate Good  Judgment: Intact  Insight: Present   Executive Functions  Concentration: Good  Attention Span: Good  Recall: Good  Fund of Knowledge: Good  Language:  Good   Psychomotor Activity  Psychomotor Activity: Psychomotor Activity: Normal   Assets  Assets: Communication Skills; Desire for Improvement; Social Support   Sleep  Sleep: Sleep: Good   Nutritional Assessment (For OBS and FBC admissions only) Has the patient had a weight loss or gain of 10 pounds or more in the last 3 months?: No Has the patient had a decrease in food intake/or appetite?: No Does the patient have dental problems?: No Does the patient have eating habits or behaviors that may be indicators of an eating disorder including binging or inducing vomiting?: No Has the patient recently lost weight without trying?: 0 Has the patient been eating poorly because of a decreased appetite?: 0 Malnutrition Screening Tool Score: 0    Physical Exam  Physical Exam Constitutional:      General: He is not in acute distress.    Appearance: He is not diaphoretic.  HENT:     Head: Normocephalic.     Right Ear: External ear normal.     Left Ear: External ear normal.     Nose: No congestion.  Eyes:     General:        Right eye: No discharge.        Left eye: No discharge.  Cardiovascular:     Rate and Rhythm: Normal rate.  Pulmonary:     Effort: No respiratory distress.  Chest:      Chest wall: No tenderness.  Neurological:     Mental Status: He is alert and oriented to person, place, and time.  Psychiatric:        Attention and Perception: Attention and perception normal.        Mood and Affect: Mood and affect normal.        Speech: Speech normal.        Behavior: Behavior is cooperative.        Thought Content: Thought content normal. Thought content is not paranoid or delusional. Thought content does not include homicidal or suicidal ideation. Thought content does not include homicidal or suicidal plan.        Cognition and Memory: Cognition and memory normal.    Review of Systems  Constitutional:  Negative for chills, diaphoresis and fever.  HENT:  Negative for congestion.   Eyes:  Negative for discharge.  Respiratory:  Negative for cough, shortness of breath and wheezing.   Cardiovascular:  Negative for chest pain and palpitations.  Gastrointestinal:  Negative for diarrhea, nausea and vomiting.  Neurological:  Negative for dizziness, seizures, loss of consciousness, weakness and headaches.  Psychiatric/Behavioral:  Positive for substance abuse.    Blood pressure 110/74, pulse 72, temperature 98.7 F (37.1 C), temperature source Oral, resp. rate 16, SpO2 97 %. There is no height or weight on file to calculate BMI.  Demographic Factors:  Male and Caucasian  Loss Factors: NA  Historical Factors: NA  Risk Reduction Factors:   Living with another person, especially a relative, Positive social support, Positive therapeutic relationship, and Positive coping skills or problem solving skills  Continued Clinical Symptoms:  Alcohol/Substance Abuse/Dependencies More than one psychiatric diagnosis  Cognitive Features That Contribute To Risk:  None    Suicide Risk:  Minimal: No identifiable suicidal ideation.  Patients presenting with no risk factors but with morbid ruminations; may be classified as minimal risk based on the severity of the depressive  symptoms  Plan Of Care/Follow-up recommendations:  Activity:  As tolerated Diet:  Regular  Disposition: Discharge AMA per patient request,  follow up with [facility].   Mancel Bale, NP 03/24/2023, 8:52 PM
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
## 2023-03-25 LAB — PROLACTIN: Prolactin: 7.3 ng/mL (ref 3.9–22.7)

## 2023-06-22 ENCOUNTER — Ambulatory Visit: Payer: Medicare Other | Admitting: Infectious Diseases

## 2023-06-22 DIAGNOSIS — A530 Latent syphilis, unspecified as early or late: Secondary | ICD-10-CM | POA: Insufficient documentation

## 2023-06-22 DIAGNOSIS — F419 Anxiety disorder, unspecified: Secondary | ICD-10-CM | POA: Insufficient documentation

## 2023-06-22 DIAGNOSIS — Z91018 Allergy to other foods: Secondary | ICD-10-CM | POA: Insufficient documentation

## 2023-07-01 ENCOUNTER — Encounter: Payer: Self-pay | Admitting: Infectious Diseases

## 2023-07-01 ENCOUNTER — Telehealth: Payer: Self-pay

## 2023-07-01 ENCOUNTER — Telehealth: Payer: Self-pay | Admitting: Pharmacist

## 2023-07-01 ENCOUNTER — Ambulatory Visit (INDEPENDENT_AMBULATORY_CARE_PROVIDER_SITE_OTHER): Payer: Medicare Other | Admitting: Infectious Diseases

## 2023-07-01 ENCOUNTER — Other Ambulatory Visit (HOSPITAL_COMMUNITY): Payer: Self-pay

## 2023-07-01 ENCOUNTER — Other Ambulatory Visit: Payer: Self-pay

## 2023-07-01 VITALS — BP 117/75 | HR 76 | Temp 98.2°F | Ht 69.0 in | Wt 147.0 lb

## 2023-07-01 DIAGNOSIS — Z8619 Personal history of other infectious and parasitic diseases: Secondary | ICD-10-CM | POA: Insufficient documentation

## 2023-07-01 DIAGNOSIS — Z1159 Encounter for screening for other viral diseases: Secondary | ICD-10-CM | POA: Diagnosis present

## 2023-07-01 NOTE — Assessment & Plan Note (Signed)
Edward Griffith had successful treatment in 2021 for latent syphilis with titer 1:4. I don't see any follow up titers since treatment up until September 2024 where titer is persistent at 1:4. He has a male partner who is HIV(+) and well maintained on daily treatment and undetectable. He has not had any other sexual partners since that time when he was treated and reports no concern for new infection risk. I suspect he will remain serofast with his titer - would watch for 4-fold increase which would be concern for new infection. No need for repeat titer today since he just had one. Would repeat again in a few months (3-4) timed with PrEP labs.   He is interested in PrEP - I think apretude would be great for him. Will have him meet with Pharmacy team today.  Had non-reactive HIV 4th generation test in early September 2024 that he showed me on [facility] today.  Check HBV sAb to see if he needs booster (sAg recently negative, as was HCV Ab).

## 2023-07-01 NOTE — Telephone Encounter (Signed)
[facility] Patient Advocate Encounter   Received notification from [facility] that prior authorization for Apretude is required.   PA submitted on 07/01/23 Key [id number] Status is pending    [facility] Clinic will continue to follow.   Clearance Coots, CPhT Specialty Pharmacy Patient [facility] for Infectious Disease Phone: [phone] Fax:  [phone]

## 2023-07-01 NOTE — Patient Instructions (Addendum)
Nice to meet you   Will see if you need a booster for hepatitis B immunity - will let you know when the blood test is back.    Your blood antibody still being reactive is considered "serofast" - we would need to monitor that titer to make sure there is no increase in the number - would be concerned if it is > 1:16. That would mean your antibodies is fighting harder against what we worry to be new infection.   You don't need any further treatment at this time - you received the adequate treatment in 2021.  Would recommend screening at least yearly or more frequently if new partners to ensure we can follow for new infection  Would be happy to get you on PrEP - you spoke with Marchelle Folks today for that.

## 2023-07-01 NOTE — Telephone Encounter (Signed)
Hulen Skains, patient will be starting Apretude - can you complete prior auth for him? Thanks!  Counseled that Apretude is one intramuscular injection in the gluteal muscle for each visit. Explained that the second injection is 30 days after the initial injection then every 2 months thereafter. Discussed follow up appointments moving forward. Screened patient for acute HIV symptoms such as fatigue, muscle aches, rash, sore throat, lymphadenopathy, headache, night sweats, nausea/vomiting/diarrhea, and fever. Patient denies any symptoms.   Explained that showing up to injection appointments is very important and warned that if appointments are missed, protection will be minimal and the risk of acquiring HIV becomes much higher. Counseled on possible side effects associated with the injections such as injection site pain, which is usually mild to moderate in nature, injection site nodules, and injection site reactions. Asked to call the clinic or send me a mychart message if they experience any issues. Advised that they can take Motrin or Tylenol for injection site pain if needed. They may also pre-treat with Motrin or Tylenol about 30-45 minutes before scheduled appointments.  Margarite Gouge, PharmD, CPP, BCIDP, AAHIVP Clinical Pharmacist Practitioner Infectious Diseases Clinical Pharmacist [facility] for Infect[facility] Disease

## 2023-07-01 NOTE — Progress Notes (Signed)
Subjective:     Edward Griffith is a 32 y.o. male with persistently reactive RPR titer.   He is with a new male partner and wants to make sure his testing is OK and not still considered infectious. He initially had reactive RPR 1:4 titer in June 2021 - completed 3 rounds of bicillin 2.4 million units 03/27/20, 04/03/20, 04/10/20 for latent syphilis of unknown duration. He had zero symptoms at that time.  After his treatment he did not have follow up RPRs done from records I reviewed on his Mychart - He did not have any new partners in between these male partners and did not have any new syphilis exposure.  He previously had a male partner that had many other sexual partners and was exposed to syphilis then. He alludes to history of some male partners but not presently.   Does drink a lot of alcohol regularly and is on some other medications for his heart. He thinks for PrEP he would do better with injectable to help with adherence.    Review of Systems: Review of Systems  Constitutional:  Negative for chills and fever.  HENT:  Negative for sore throat.   Eyes:  Negative for visual disturbance.  Gastrointestinal:  Negative for abdominal pain, anal bleeding and rectal pain.  Genitourinary:  Negative for dysuria, genital sores, penile discharge, penile pain, scrotal swelling and testicular pain.  Musculoskeletal:  Negative for arthralgias and joint swelling.  Skin:  Negative for rash.  Neurological:  Negative for headaches.  Hematological:  Negative for adenopathy.     Past Medical History:  Diagnosis Date   ADHD (attention deficit hyperactivity disorder)    Asthma    as a child   Substance abuse (HCC)     No outpatient medications prior to visit.   No facility-administered medications prior to visit.    Allergies  Allergen Reactions   Atomoxetine Hcl Other (See Comments)    Psychosis   Peanut Oil Swelling    Swelling of Throat and Hives   Peanut-Containing Drug  Products Anaphylaxis   Aripiprazole Other (See Comments)   Atomoxetine Other (See Comments)   Fruit Extracts     Itchy mouth   Coconut (Cocos Nucifera) Rash   Soy Allergy Rash    Family History  Problem Relation Age of Onset   Schizophrenia Mother    Alcohol abuse Father         Objective:  Objective  Vitals:   07/01/23 1256  BP: 117/75  Pulse: 76  Temp: 98.2 F (36.8 C)  SpO2: 95%   Body mass index is 21.71 kg/m.   Physical Exam  Physical Exam Constitutional:      Appearance: Normal appearance. He is not ill-appearing.  HENT:     Head: Normocephalic.     Mouth/Throat:     Mouth: Mucous membranes are moist.     Pharynx: Oropharynx is clear.  Eyes:     General: No scleral icterus. Cardiovascular:     Rate and Rhythm: Normal rate and regular rhythm.  Pulmonary:     Effort: Pulmonary effort is normal.  Musculoskeletal:        General: Normal range of motion.     Cervical back: Normal range of motion.  Skin:    Coloration: Skin is not jaundiced or pale.  Neurological:     Mental Status: He is alert and oriented to person, place, and time.  Psychiatric:        Mood  and Affect: Mood normal.        Judgment: Judgment normal.        Assessment & Plan:    Problem List Items Addressed This Visit       Unprioritized   History of syphilis    Delia had successful treatment in 2021 for latent syphilis with titer 1:4. I don't see any follow up titers since treatment up until September 2024 where titer is persistent at 1:4. He has a male partner who is HIV(+) and well maintained on daily treatment and undetectable. He has not had any other sexual partners since that time when he was treated and reports no concern for new infection risk. I suspect he will remain serofast with his titer - would watch for 4-fold increase which would be concern for new infection. No need for repeat titer today since he just had one. Would repeat again in a few months (3-4) timed  with PrEP labs.   He is interested in PrEP - I think apretude would be great for him. Will have him meet with Pharmacy team today.  Had non-reactive HIV 4th generation test in early September 2024 that he showed me on [facility] today.  Check HBV sAb to see if he needs booster (sAg recently negative, as was HCV Ab).       Other Visit Diagnoses     Need for hepatitis B screening test    -  Primary   Relevant Orders   Hepatitis B surface antibody,qualitative       Rexene Alberts, MSN, NP-C [facility] for Infectious Disease [facility] Office: [phone] Pager: [phone]

## 2023-07-02 LAB — HEPATITIS B SURFACE ANTIBODY,QUALITATIVE: Hep B S Ab: REACTIVE — AB

## 2023-07-03 NOTE — Telephone Encounter (Signed)
[employer] denied Apretude approval. Submitted and faxed appeal today; will await response. Marchelle Folks

## 2023-07-06 ENCOUNTER — Telehealth: Payer: Self-pay

## 2023-07-06 ENCOUNTER — Other Ambulatory Visit (HOSPITAL_COMMUNITY): Payer: Self-pay

## 2023-07-06 DIAGNOSIS — Z79899 Other long term (current) drug therapy: Secondary | ICD-10-CM

## 2023-07-06 NOTE — Telephone Encounter (Signed)
Pharmacy Patient Advocate Encounter- Apretude BIV-Pharmacy Benefit:  PA was submitted to [facility] and has been approved through: 07/02/23-10/05/2098 Authorization# [id number]  Please send prescription to Specialty Pharmacy: [facility]: [phone]  Estimated Copay is: $4.60

## 2023-07-07 ENCOUNTER — Other Ambulatory Visit: Payer: Self-pay

## 2023-07-07 ENCOUNTER — Other Ambulatory Visit (HOSPITAL_COMMUNITY): Payer: Self-pay

## 2023-07-07 ENCOUNTER — Other Ambulatory Visit: Payer: Self-pay | Admitting: Pharmacist

## 2023-07-07 MED ORDER — APRETUDE 600 MG/3ML IM SUER
600.0000 mg | INTRAMUSCULAR | 5 refills | Status: DC
Start: 2023-07-07 — End: 2023-07-07
  Filled 2023-07-07: qty 3, 60d supply, fill #0

## 2023-07-07 MED ORDER — APRETUDE 600 MG/3ML IM SUER
600.0000 mg | INTRAMUSCULAR | 1 refills | Status: DC
Start: 2023-07-07 — End: 2023-09-18
  Filled 2023-07-07: qty 3, 30d supply, fill #0
  Filled 2023-07-31: qty 3, 30d supply, fill #1

## 2023-07-07 NOTE — Addendum Note (Signed)
Addended by: Jennette Kettle on: 07/07/2023 09:42 AM   Modules accepted: Orders

## 2023-07-07 NOTE — Progress Notes (Signed)
New script will be placed on file

## 2023-07-07 NOTE — Telephone Encounter (Signed)
Script sent to [facility]. Spoke with patient, and he is scheduled for first injections on 10/3 with me. Thanks Lupita Leash!

## 2023-07-07 NOTE — Progress Notes (Signed)
Specialty Pharmacy Initial Fill Coordination Note  Edward Griffith is a 32 y.o. male contacted today regarding refills of specialty medication(s) Cabotegravir .  Patient requested Courier to Provider Office  on 07/08/23  to verified address [facility]  [address] [place] [address]   Medication will be filled on 07/07/23.   Patient is aware of $4.60 copayment.

## 2023-07-08 ENCOUNTER — Telehealth: Payer: Self-pay

## 2023-07-08 ENCOUNTER — Other Ambulatory Visit: Payer: Self-pay

## 2023-07-08 NOTE — Telephone Encounter (Signed)
[facility] Patient Advocate Encounter  Patient's medication (Apretude) have been couriered to [facility] from [facility] pharmacy and will be administered on the patient next office visit on 07/09/23.  Clearance Coots , CPhT Specialty Pharmacy Patient [facility] for Infectious Disease Phone: [phone] Fax:  [phone]

## 2023-07-09 ENCOUNTER — Ambulatory Visit: Payer: Medicare Other | Admitting: Pharmacist

## 2023-07-09 ENCOUNTER — Other Ambulatory Visit: Payer: Self-pay

## 2023-07-09 VITALS — BP 122/78 | HR 70

## 2023-07-09 DIAGNOSIS — Z23 Encounter for immunization: Secondary | ICD-10-CM | POA: Diagnosis not present

## 2023-07-09 DIAGNOSIS — Z2981 Encounter for HIV pre-exposure prophylaxis: Secondary | ICD-10-CM | POA: Diagnosis present

## 2023-07-09 DIAGNOSIS — R75 Inconclusive laboratory evidence of human immunodeficiency virus [HIV]: Secondary | ICD-10-CM

## 2023-07-09 DIAGNOSIS — Z79899 Other long term (current) drug therapy: Secondary | ICD-10-CM

## 2023-07-09 MED ORDER — CABOTEGRAVIR ER 600 MG/3ML IM SUER
600.0000 mg | Freq: Once | INTRAMUSCULAR | Status: AC
Start: 2023-07-09 — End: 2023-07-09
  Administered 2023-07-09: 600 mg via INTRAMUSCULAR

## 2023-07-09 NOTE — Progress Notes (Signed)
NEW REFERRAL TO CPP CLINIC    HPI: Edward Griffith is a 32 y.o. male who presents to the [facility] pharmacy clinic for Apretude administration and HIV PrEP follow up.  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   Failure to thrive in newborn 09/15/2016   Failure to thrive (child) 09/15/2016   Family history of alcoholism in father 09/08/2016   Autism spectrum disorder 09/03/2016   Alcohol use disorder, severe, dependence (HCC) 09/03/2016   Stimulant dependence (HCC) 09/03/2016   Cannabis abuse, episodic use 09/03/2016   Attention deficit hyperactivity disorder (ADHD) 09/03/2016   History of posttraumatic stress disorder (PTSD) 09/03/2016    Patient's Medications  New Prescriptions   No medications on file  Previous Medications   CABOTEGRAVIR ER (APRETUDE) 600 MG/3ML INJECTION    Inject 3 mLs (600 mg total) into the muscle every 30 (thirty) days.  Modified Medications   No medications on file  Discontinued Medications   No medications on file    Allergies: Allergies  Allergen Reactions   Atomoxetine Hcl Other (See Comments)    Psychosis   Peanut Oil Swelling    Swelling of Throat and Hives   Peanut-Containing Drug Products Anaphylaxis   Aripiprazole Other (See Comments)   Atomoxetine Other (See Comments)   Fruit Extracts     Itchy mouth   Coconut (Cocos Nucifera) Rash   Soy Allergy Rash    Past Medical History: Past Medical History:  Diagnosis Date   ADHD (attention deficit hyperactivity disorder)    Asthma    as a child   Substance abuse (HCC)     Social History: Social History   Socioeconomic History   Marital status: Single    Spouse name:  Not on file   Number of children: Not on file   Years of education: Not on file   Highest education level: Not on file  Occupational History   Not on file  Tobacco Use   Smoking status: Former    Current packs/day: 0.15    Types: Cigarettes   Smokeless tobacco: Never  Vaping Use   Vaping status: Every Day  Substance and Sexual Activity   Alcohol use: Yes    Alcohol/week: 5.0 standard drinks of alcohol    Types: 5 Cans of beer per week   Drug use: Yes    Types: Cocaine    Comment: last use cocaine 3 weeks (as of 05/08/22)   Sexual activity: Not on file  Other Topics Concern   Not on file  Social History Narrative   Not on file   Social Determinants of Health   Financial Resource Strain: Not on file  Food Insecurity: Not on file  Transportation Needs: Not on file  Physical Activity: Not on file  Stress: Not on file  Social Connections: Not on file    Labs: No results found for: "HIV1RNAQUANT", "HIV1RNAVL", "CD4TABS"  RPR and STI No results found for: "LABRPR", "RPRTITER"      No data to display          Hepatitis B Lab Results  Component Value Date   HEPBSAB REACTIVE (A) 07/01/2023   Hepatitis C No results found for: "HEPCAB", "HCVRNAPCRQN" Hepatitis A No  results found for: "HAV" Lipids: Lab Results  Component Value Date   CHOL 207 (H) 03/24/2023   TRIG 39 03/24/2023   HDL 83 03/24/2023   CHOLHDL 2.5 03/24/2023   VLDL 8 03/24/2023   LDLCALC 116 (H) 03/24/2023    TARGET DATE: The 3rd of the month  Current PrEP Regimen: Apretude  Assessment: Edward Griffith presents today for their first initiation injection of Apretude and to follow up for HIV PrEP.  Counseled that Apretude is one intramuscular injection in the gluteal muscle for each visit. Explained that the second injection is 30 days after the initial injection then every 2 months thereafter. Discussed follow up appointments moving forward. Screened patient for acute HIV symptoms such as fatigue,  muscle aches, rash, sore throat, lymphadenopathy, headache, night sweats, nausea/vomiting/diarrhea, and fever. Patient denies any symptoms.   Explained that showing up to injection appointments is very important and warned that if appointments are missed, protection will be minimal and the risk of acquiring HIV becomes much higher. Counseled on possible side effects associated with the injections such as injection site pain, which is usually mild to moderate in nature, injection site nodules, and injection site reactions. Asked to call the clinic or send me a mychart message if they experience any issues. Advised that they can take Motrin or Tylenol for injection site pain if needed. They may also pre-treat with Motrin or Tylenol about 30-45 minutes before scheduled appointments.    Per [employer] guidelines, a rapid HIV test should be drawn prior to Apretude administration. Due to state shortage of rapid HIV tests, this is temporarily unable to be done. Per decision from [facility] physicians, we will proceed with Apretude administration at this time without a negative rapid HIV test beforehand. HIV RNA was collected today and is in process.  Administered cabotegravir 600mg /34mL in left upper outer quadrant of the gluteal muscle. Monitored patient for 10 minutes after injection. Injections were tolerated well without issue. Will make follow up appointments for second initiation injection in 30 days and then maintenance injections every 2 months thereafter.   Remains exclusive with one HIV(+) sexual partner; politely declines STI testing today. Accepts COVID and flu vaccines.   Plan: - First Apretude injection administered - Second initiation injection scheduled for 10/31 with me  - Maintenance injections scheduled for 1/2 with me  - Check HIV RNA  - Administer flu and COVID vaccines - Call with any issues or questions  Margarite Gouge, PharmD, CPP, BCIDP, AAHIVP Clinical Pharmacist  Practitioner Infectious Diseases Clinical Pharmacist [facility]

## 2023-07-09 NOTE — Progress Notes (Signed)
RN called to lab due to patient feeling faint after lab draw. Elber says this is typical after blood draws and that he usually feels better in 5 minutes or so.   Initial vitals were hypotensive and bradycardic. He remained alert and oriented throughout the episode. He says he is seeking care with a cardiologist for irregular pulse.   After resting for 5 minutes, vitals stabilized and patient reports he's feeling much better. He walked to the clinic from his home today which is about 10 minutes away. He ambulated to the front desk with a steady gait and without assistance.   Sandie Ano, RN

## 2023-07-11 LAB — HIV-1 RNA QUANT-NO REFLEX-BLD
HIV 1 RNA Quant: NOT DETECTED {copies}/mL
HIV-1 RNA Quant, Log: NOT DETECTED {Log}

## 2023-07-24 ENCOUNTER — Other Ambulatory Visit: Payer: Self-pay

## 2023-07-31 ENCOUNTER — Other Ambulatory Visit: Payer: Self-pay

## 2023-07-31 ENCOUNTER — Other Ambulatory Visit (HOSPITAL_COMMUNITY): Payer: Self-pay

## 2023-07-31 NOTE — Progress Notes (Signed)
Specialty Pharmacy Refill Coordination Note  Edward Griffith is a 32 y.o. male assessed today regarding refills of clinic administered specialty medication(s) Cabotegravir   Clinic requested Courier to Provider Office   Delivery date: 08/03/23   Verified address: [facility] [address] [place] [address]   Medication will be filled on 07/31/23.

## 2023-08-03 ENCOUNTER — Other Ambulatory Visit: Payer: Self-pay

## 2023-08-03 ENCOUNTER — Other Ambulatory Visit (HOSPITAL_COMMUNITY): Payer: Self-pay

## 2023-08-04 ENCOUNTER — Other Ambulatory Visit (HOSPITAL_COMMUNITY): Payer: Self-pay

## 2023-08-05 ENCOUNTER — Telehealth: Payer: Self-pay

## 2023-08-05 NOTE — Telephone Encounter (Signed)
[facility] Patient Advocate Encounter  Patient's medications APRETUDE have been couriered to [facility] from [facility] pharmacy and will be administered at the patients appointment on 08/06/23.  Kae Heller, CPhT Specialty Pharmacy Patient [facility] for Infectious Disease Phone: [phone] Fax:  [phone]

## 2023-08-05 NOTE — Progress Notes (Unsigned)
HPI: Edward Griffith is a 32 y.o. male who presents to the [facility] pharmacy clinic for Apretude administration and HIV PrEP follow up.  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   Failure to thrive in newborn 09/15/2016   Failure to thrive (child) 09/15/2016   Family history of alcoholism in father 09/08/2016   Autism spectrum disorder 09/03/2016   Alcohol use disorder, severe, dependence (HCC) 09/03/2016   Stimulant dependence (HCC) 09/03/2016   Cannabis abuse, episodic use 09/03/2016   Attention deficit hyperactivity disorder (ADHD) 09/03/2016   History of posttraumatic stress disorder (PTSD) 09/03/2016    Patient's Medications  New Prescriptions   No medications on file  Previous Medications   CABOTEGRAVIR ER (APRETUDE) 600 MG/3ML INJECTION    Inject 3 mLs (600 mg total) into the muscle every 30 (thirty) days.  Modified Medications   No medications on file  Discontinued Medications   No medications on file    Allergies: Allergies  Allergen Reactions   Atomoxetine Hcl Other (See Comments)    Psychosis   Peanut Oil Swelling    Swelling of Throat and Hives   Peanut-Containing Drug Products Anaphylaxis   Aripiprazole Other (See Comments)   Atomoxetine Other (See Comments)   Fruit Extracts     Itchy mouth   Coconut (Cocos Nucifera) Rash   Soy Allergy Rash    Past Medical History: Past Medical History:  Diagnosis Date   ADHD (attention deficit hyperactivity disorder)    Asthma    as a child   Substance abuse (HCC)     Social History: Social History   Socioeconomic History   Marital status: Single    Spouse name: Not on file   Number of  children: Not on file   Years of education: Not on file   Highest education level: Not on file  Occupational History   Not on file  Tobacco Use   Smoking status: Former    Current packs/day: 0.15    Types: Cigarettes   Smokeless tobacco: Never  Vaping Use   Vaping status: Every Day  Substance and Sexual Activity   Alcohol use: Yes    Alcohol/week: 5.0 standard drinks of alcohol    Types: 5 Cans of beer per week   Drug use: Yes    Types: Cocaine    Comment: last use cocaine 3 weeks (as of 05/08/22)   Sexual activity: Not on file  Other Topics Concern   Not on file  Social History Narrative   Not on file   Social Determinants of Health   Financial Resource Strain: Not on file  Food Insecurity: Not on file  Transportation Needs: Not on file  Physical Activity: Not on file  Stress: Not on file  Social Connections: Not on file    Labs: Lab Results  Component Value Date   HIV1RNAQUANT Not Detected 07/09/2023    RPR and STI No results found for: "LABRPR", "RPRTITER"      No data to display          Hepatitis B Lab Results  Component Value Date   HEPBSAB REACTIVE (A) 07/01/2023   Hepatitis C No results found for: "HEPCAB", "HCVRNAPCRQN" Hepatitis A No  results found for: "HAV" Lipids: Lab Results  Component Value Date   CHOL 207 (H) 03/24/2023   TRIG 39 03/24/2023   HDL 83 03/24/2023   CHOLHDL 2.5 03/24/2023   VLDL 8 03/24/2023   LDLCALC 116 (H) 03/24/2023    TARGET DATE: The 3rd of the month  Assessment: Edward Griffith presents today for their Apretude injection and to follow up for HIV PrEP. States he experienced significant pain and soreness for 3 days following his first injection along with non-painful knots that lasted ~2 weeks. Screened patient for acute HIV symptoms such as fatigue, muscle aches, rash, sore throat, lymphadenopathy, headache, night sweats, nausea/vomiting/diarrhea, and fever. Patient denies any symptoms.   Per [employer] guidelines,  a rapid HIV test should be drawn prior to Apretude administration. Due to state shortage of rapid HIV tests, this is temporarily unable to be done. Per decision from [facility] physicians, we will proceed with Apretude administration at this time without a negative rapid HIV test beforehand. HIV RNA was collected today and is in process.  Administered cabotegravir 600mg /66mL in left upper outer quadrant of the gluteal muscle. Will make follow up appointments for maintenance injections every 2 months.    No new partners since last injection and politely declines STI testing today. Up-to-date on vaccines. Would prefer to transition to oral PrEP after this injection given frequent blood draws with Apretude. Reviewed that he will still need blood draws every 3 months on oral PrEP, and he preferred this route. Discussed that as he is in a stable exclusive relationship, we can eventually transition out to 6 month visits as well. Will review insurance preferences for Truv[employer] versus Descovy before his January appointment.   Plan: - Administer Apretude 600 mg x 1  - PrEP follow up on 1/2 with me  - Check HIV RNA - Call with any issues or questions  Margarite Gouge, PharmD, CPP, BCIDP, AAHIVP Clinical Pharmacist Practitioner Infectious Diseases Clinical Pharmacist [facility]

## 2023-08-06 ENCOUNTER — Other Ambulatory Visit: Payer: Self-pay

## 2023-08-06 ENCOUNTER — Ambulatory Visit (INDEPENDENT_AMBULATORY_CARE_PROVIDER_SITE_OTHER): Payer: Medicare Other | Admitting: Pharmacist

## 2023-08-06 DIAGNOSIS — Z79899 Other long term (current) drug therapy: Secondary | ICD-10-CM

## 2023-08-06 DIAGNOSIS — R75 Inconclusive laboratory evidence of human immunodeficiency virus [HIV]: Secondary | ICD-10-CM

## 2023-08-06 DIAGNOSIS — Z2981 Encounter for HIV pre-exposure prophylaxis: Secondary | ICD-10-CM

## 2023-08-06 MED ORDER — CABOTEGRAVIR ER 600 MG/3ML IM SUER
600.0000 mg | Freq: Once | INTRAMUSCULAR | Status: AC
Start: 2023-08-06 — End: 2023-08-06
  Administered 2023-08-06: 600 mg via INTRAMUSCULAR

## 2023-08-10 ENCOUNTER — Other Ambulatory Visit: Payer: Self-pay

## 2023-08-10 LAB — HIV-1 RNA QUANT-NO REFLEX-BLD
HIV 1 RNA Quant: NOT DETECTED {copies}/mL
HIV-1 RNA Quant, Log: NOT DETECTED {Log_copies}/mL

## 2023-08-13 ENCOUNTER — Other Ambulatory Visit: Payer: Self-pay

## 2023-08-18 ENCOUNTER — Other Ambulatory Visit: Payer: Self-pay

## 2023-08-24 ENCOUNTER — Ambulatory Visit: Payer: Medicare Other | Attending: Internal Medicine | Admitting: Internal Medicine

## 2023-08-24 NOTE — Progress Notes (Deleted)
  Cardiology Office Note:  .    Date:  08/24/2023  ID:  Edward Griffith, DOB [birth year], MRN [id number] PCP: Farris Has, MD  [facility] Providers Cardiologist:  None { Click to update primary MD,subspecialty MD or APP then REFRESH:1}    CC: *** Consulted for the evaluation of syncope at the behest of Dr. Lucianne Muss   History of Present Illness: .    Edward Griffith is a 32 y.o. male with a history of alcohol induced    Relevant histories: .  Social *** ROS: As per HPI.   Studies Reviewed: .       *** Risk Assessment/Calculations:    {Does this patient have ATRIAL FIBRILLATION?:[id number]}       Physical Exam:    VS:  There were no vitals taken for this visit.   Wt Readings from Last 3 Encounters:  07/01/23 147 lb (66.7 kg)  08/26/22 145 lb 4.8 oz (65.9 kg)  05/09/22 140 lb (63.5 kg)    Gen: *** distress, *** obese/well nourished/malnourished   Neck: No JVD, *** carotid bruit Ears: *** Frank Sign Cardiac: No Rubs or Gallops, *** Murmur, ***cardia, *** radial pulses Respiratory: Clear to auscultation bilaterally, *** effort, ***  respiratory rate GI: Soft, nontender, non-distended *** MS: No *** edema; *** moves all extremities Integument: Skin feels *** Neuro:  At time of evaluation, alert and oriented to person/place/time/situation *** Psych: Normal affect, patient feels ***   ASSESSMENT AND PLAN: .    ***   Riley Lam, MD FASE [facility] Cardiologist [facility]  [address], [address] [place], [place] [address] [phone]  9:25 AM

## 2023-08-25 ENCOUNTER — Encounter: Payer: Self-pay | Admitting: Internal Medicine

## 2023-09-18 ENCOUNTER — Other Ambulatory Visit: Payer: Self-pay

## 2023-09-18 ENCOUNTER — Other Ambulatory Visit (HOSPITAL_COMMUNITY): Payer: Self-pay

## 2023-09-18 ENCOUNTER — Other Ambulatory Visit: Payer: Self-pay | Admitting: Pharmacist

## 2023-09-18 DIAGNOSIS — Z79899 Other long term (current) drug therapy: Secondary | ICD-10-CM

## 2023-09-18 MED ORDER — APRETUDE 600 MG/3ML IM SUER
600.0000 mg | INTRAMUSCULAR | 5 refills | Status: AC
Start: 2023-09-18 — End: ?
  Filled 2023-09-18: qty 3, 60d supply, fill #0
  Filled 2023-11-20: qty 3, 60d supply, fill #1
  Filled 2024-01-11: qty 3, 60d supply, fill #2

## 2023-09-18 NOTE — Progress Notes (Signed)
Specialty Pharmacy Refill Coordination Note  Edward Griffith is a 32 y.o. male assessed today regarding refills of clinic administered specialty medication(s) Cabotegravir (Apretude)   Clinic requested Courier to Provider Office   Delivery date: 09/28/23   Verified address: [address] [place] [address]   Medication will be filled on 09/25/23.

## 2023-09-25 ENCOUNTER — Other Ambulatory Visit (HOSPITAL_COMMUNITY): Payer: Self-pay

## 2023-09-25 ENCOUNTER — Other Ambulatory Visit: Payer: Self-pay

## 2023-09-28 ENCOUNTER — Other Ambulatory Visit (HOSPITAL_COMMUNITY): Payer: Self-pay

## 2023-09-28 ENCOUNTER — Telehealth: Payer: Self-pay

## 2023-09-28 NOTE — Telephone Encounter (Addendum)
[facility] Patient Advocate Encounter  Patient's medications APRETUDE have been couriered to [facility] from [facility] and will be administered at the patients appointment on 10/08/23.  Kae Heller, CPhT Specialty Pharmacy Patient [facility] for Infectious Disease Phone: [phone] Fax:  [phone]

## 2023-10-06 NOTE — Progress Notes (Deleted)
 Date:  10/06/2023   HPI: Edward Griffith is a 32 y.o. male who presents to the [facility] pharmacy clinic for HIV PrEP follow-up.  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   Failure to thrive in newborn 09/15/2016   Failure to thrive (child) 09/15/2016   Family history of alcoholism in father 09/08/2016   Autism spectrum disorder 09/03/2016   Alcohol use disorder, severe, dependence (HCC) 09/03/2016   Stimulant dependence (HCC) 09/03/2016   Cannabis abuse, episodic use 09/03/2016   Attention deficit hyperactivity disorder (ADHD) 09/03/2016   History of posttraumatic stress disorder (PTSD) 09/03/2016    Patient's Medications  New Prescriptions   No medications on file  Previous Medications   CABOTEGRAVIR  ER (APRETUDE ) 600 MG/3ML INJECTION    Inject 3 mLs (600 mg total) into the muscle every 2 (two) months.  Modified Medications   No medications on file  Discontinued Medications   No medications on file    Allergies: Allergies  Allergen Reactions   Atomoxetine Hcl Other (See Comments)    Psychosis   Peanut Oil Swelling    Swelling of Throat and Hives   Peanut-Containing Drug Products Anaphylaxis   Aripiprazole Other (See Comments)   Atomoxetine Other (See Comments)   Fruit Extracts     Itchy mouth   Coconut (Cocos Nucifera) Rash   Soy Allergy (Do Not Select) Rash    Past Medical History: Past Medical History:  Diagnosis Date   ADHD (attention deficit hyperactivity disorder)    Anxiety    Asthma    as a child   Substance abuse (HCC)    Syphilis     Social History: Social History   Socioeconomic History    Marital status: Single    Spouse name: Not on file   Number of children: Not on file   Years of education: Not on file   Highest education level: Not on file  Occupational History   Not on file  Tobacco Use   Smoking status: Former    Current packs/day: 0.15    Types: Cigarettes   Smokeless tobacco: Never  Vaping Use   Vaping status: Every Day  Substance and Sexual Activity   Alcohol use: Yes    Alcohol/week: 5.0 standard drinks of alcohol    Types: 5 Cans of beer per week   Drug use: Yes    Types: Cocaine    Comment: last use cocaine 3 weeks (as of 05/08/22)   Sexual activity: Not on file  Other Topics Concern   Not on file  Social History Narrative   Not on file   Social Drivers of Health   Financial Resource Strain: Not on file  Food Insecurity: Not on file  Transportation Needs: Not on file  Physical Activity: Not on file  Stress: Not on file  Social Connections: Not on file        No data to display          Labs:  SCr: Lab Results  Component Value Date   CREATININE 0.86 03/24/2023   CREATININE 0.84 08/26/2022   CREATININE 0.85 05/09/2022   CREATININE 1.09 10/01/2016  HIV No results found for: HIV Hepatitis B Lab Results  Component Value Date   HEPBSAB REACTIVE (A) 07/01/2023   Hepatitis C No results found for: HEPCAB, HCVRNAPCRQN Hepatitis A No results found for: HAV RPR and STI No results found for: LABRPR, RPRTITER      No data to display          Assessment: Edward Griffith presents to clinic today for PrEP follow-up. He would like to transition off Apretude  to oral therapy due to frequent blood draws.  Screened patient for acute HIV symptoms such as fatigue, muscle aches, rash, sore throat, lymphadenopathy, headache, night sweats, nausea/vomiting/diarrhea, and fever.    Last STI screening was *** and was negative. *** new sexual partners since last visit; will check *** today.  Plan: - Check HIV antibody - If HIV  antibody, fill *** x 3 months - Follow-up with *** on ***   Alan Geralds, PharmD, CPP, BCIDP, AAHIVP Clinical Pharmacist Practitioner Infectious Diseases Clinical Pharmacist [facility] 10/06/2023, 11:39 AM

## 2023-10-08 ENCOUNTER — Other Ambulatory Visit (HOSPITAL_COMMUNITY): Payer: Self-pay

## 2023-10-08 ENCOUNTER — Ambulatory Visit: Payer: Medicare Other | Admitting: Pharmacist

## 2023-11-18 ENCOUNTER — Other Ambulatory Visit (HOSPITAL_COMMUNITY)
Admission: RE | Admit: 2023-11-18 | Discharge: 2023-11-18 | Disposition: A | Discharge status: Home / Self Care | Payer: Medicare Other | Source: Ambulatory Visit | Attending: Infectious Diseases | Admitting: Infectious Diseases

## 2023-11-18 ENCOUNTER — Ambulatory Visit (INDEPENDENT_AMBULATORY_CARE_PROVIDER_SITE_OTHER): Payer: Medicare Other | Admitting: Pharmacist

## 2023-11-18 ENCOUNTER — Other Ambulatory Visit: Payer: Self-pay

## 2023-11-18 DIAGNOSIS — Z202 Contact with and (suspected) exposure to infections with a predominantly sexual mode of transmission: Secondary | ICD-10-CM | POA: Diagnosis present

## 2023-11-18 DIAGNOSIS — Z114 Encounter for screening for human immunodeficiency virus [HIV]: Secondary | ICD-10-CM | POA: Diagnosis not present

## 2023-11-18 DIAGNOSIS — Z79899 Other long term (current) drug therapy: Secondary | ICD-10-CM

## 2023-11-18 NOTE — Progress Notes (Incomplete)
   11/18/2023  HPI: Edward WOJNAROWSKI is a 33 y.o. male who presents to the [facility] clinic today for STI testing.  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   Failure to thrive in newborn 09/15/2016   Failure to thrive (child) 09/15/2016   Family history of alcoholism in father 09/08/2016   Autism spectrum disorder 09/03/2016   Alcohol use disorder, severe, dependence (HCC) 09/03/2016   Stimulant dependence (HCC) 09/03/2016   Cannabis abuse, episodic use 09/03/2016   Attention deficit hyperactivity disorder (ADHD) 09/03/2016   History of posttraumatic stress disorder (PTSD) 09/03/2016    Patient's Medications  New Prescriptions   No medications on file  Previous Medications   CABOTEGRAVIR ER (APRETUDE) 600 MG/3ML INJECTION    Inject 3 mLs (600 mg total) into the muscle every 2 (two) months.  Modified Medications   No medications on file  Discontinued Medications   No medications on file    Assessment: HIV+ partner still (male) Ex is in hospital for unknown illness that suspect is gonorrhea or chlamydia? Fever of 106 Interested in starting back on PrEP (apretude) again (fear of needles) Claims could not get into contact with [facility] clinic after christmas to schedule appointment   Plan: - STI screening: RPR, urine/pharyngeal GC/CT swabs for cytology today - F/u results to see if treatment is needed  Stephenie Acres, PharmD PGY1 Pharmacy Resident 11/18/2023 10:58 AM

## 2023-11-19 LAB — CYTOLOGY, (ORAL, ANAL, URETHRAL) ANCILLARY ONLY
Chlamydia: NEGATIVE
Comment: NEGATIVE
Comment: NORMAL
Neisseria Gonorrhea: NEGATIVE

## 2023-11-19 LAB — URINE CYTOLOGY ANCILLARY ONLY
Chlamydia: NEGATIVE
Comment: NEGATIVE
Comment: NEGATIVE
Comment: NORMAL
Neisseria Gonorrhea: NEGATIVE
Trichomonas: NEGATIVE

## 2023-11-20 ENCOUNTER — Other Ambulatory Visit (HOSPITAL_COMMUNITY): Payer: Self-pay

## 2023-11-20 ENCOUNTER — Other Ambulatory Visit: Payer: Self-pay

## 2023-11-20 NOTE — Progress Notes (Signed)
Specialty Pharmacy Refill Coordination Note  VIRL COBLE is a 33 y.o. male assessed today regarding refills of clinic administered specialty medication(s) Cabotegravir (Apretude)   Clinic requested Courier to Provider Office   Delivery date: 11/24/23   Verified address: [address] [place] [address]   Medication will be filled on 11/23/23.

## 2023-11-21 ENCOUNTER — Other Ambulatory Visit: Payer: Self-pay

## 2023-11-21 ENCOUNTER — Emergency Department (HOSPITAL_COMMUNITY)
Admission: EM | Admit: 2023-11-21 | Discharge: 2023-11-21 | Disposition: A | Discharge status: Home / Self Care | Payer: Medicare Other | Attending: Emergency Medicine | Admitting: Emergency Medicine

## 2023-11-21 ENCOUNTER — Encounter (HOSPITAL_COMMUNITY): Payer: Self-pay | Admitting: Emergency Medicine

## 2023-11-21 DIAGNOSIS — Z91010 Allergy to peanuts: Secondary | ICD-10-CM | POA: Diagnosis not present

## 2023-11-21 DIAGNOSIS — W228XXA Striking against or struck by other objects, initial encounter: Secondary | ICD-10-CM | POA: Insufficient documentation

## 2023-11-21 DIAGNOSIS — S0181XA Laceration without foreign body of other part of head, initial encounter: Secondary | ICD-10-CM | POA: Diagnosis not present

## 2023-11-21 DIAGNOSIS — S0993XA Unspecified injury of face, initial encounter: Secondary | ICD-10-CM | POA: Diagnosis present

## 2023-11-21 LAB — T PALLIDUM AB: T Pallidum Abs: POSITIVE — AB

## 2023-11-21 LAB — HIV-1 RNA QUANT-NO REFLEX-BLD
HIV 1 RNA Quant: NOT DETECTED {copies}/mL
HIV-1 RNA Quant, Log: NOT DETECTED {Log}

## 2023-11-21 LAB — RPR TITER: RPR Titer: 1:4 {titer} — ABNORMAL HIGH

## 2023-11-21 LAB — RPR: RPR Ser Ql: REACTIVE — AB

## 2023-11-21 MED ORDER — LIDOCAINE-EPINEPHRINE (PF) 2 %-1:200000 IJ SOLN
10.0000 mL | Freq: Once | INTRAMUSCULAR | Status: AC
  Administered 2023-11-21: 10 mL
  Filled 2023-11-21: qty 20

## 2023-11-21 MED ORDER — BACITRACIN ZINC 500 UNIT/GM EX OINT
TOPICAL_OINTMENT | Freq: Two times a day (BID) | CUTANEOUS | Status: DC
  Administered 2023-11-21: 1 via TOPICAL
  Filled 2023-11-21: qty 0.9
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
## 2023-11-21 NOTE — Discharge Instructions (Addendum)
 Please follow-up with your primary care provider regards to recent ER visit.  Today you asked to forego imaging so we fixed your laceration.  You have 6 sutures placed they will need to removed in 5 days.  If you have pus, fevers, skin color changes, warmth or any changes or worsening of symptoms please return to the ER.
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
## 2023-11-23 ENCOUNTER — Other Ambulatory Visit: Payer: Self-pay

## 2023-11-23 ENCOUNTER — Other Ambulatory Visit (HOSPITAL_COMMUNITY): Payer: Self-pay

## 2023-11-24 ENCOUNTER — Ambulatory Visit (INDEPENDENT_AMBULATORY_CARE_PROVIDER_SITE_OTHER): Payer: Medicare Other | Admitting: Pharmacist

## 2023-11-24 ENCOUNTER — Other Ambulatory Visit: Payer: Self-pay

## 2023-11-24 ENCOUNTER — Telehealth: Payer: Self-pay

## 2023-11-24 DIAGNOSIS — Z2981 Encounter for HIV pre-exposure prophylaxis: Secondary | ICD-10-CM | POA: Diagnosis present

## 2023-11-24 DIAGNOSIS — Z79899 Other long term (current) drug therapy: Secondary | ICD-10-CM

## 2023-11-24 MED ORDER — CABOTEGRAVIR ER 600 MG/3ML IM SUER
600.0000 mg | Freq: Once | INTRAMUSCULAR | Status: AC
  Administered 2023-11-24: 600 mg via INTRAMUSCULAR

## 2023-11-24 NOTE — Telephone Encounter (Signed)
 [facility] Patient Advocate Encounter  Patient's medications Apretude have been couriered to [facility] from [facility] pharmacy and will be administered at the patients appointment on 11/24/23.  Clearance Coots, CPhT Specialty Pharmacy Patient [facility] for Infectious Disease Phone: [phone] Fax:  [phone]

## 2023-11-24 NOTE — Progress Notes (Unsigned)
 HPI: Edward Griffith is a 33 y.o. male who presents to the [facility] pharmacy clinic for Apretude administration and HIV PrEP follow up.  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   Failure to thrive in newborn 09/15/2016   Failure to thrive (child) 09/15/2016   Family history of alcoholism in father 09/08/2016   Autism spectrum disorder 09/03/2016   Alcohol use disorder, severe, dependence (HCC) 09/03/2016   Stimulant dependence (HCC) 09/03/2016   Cannabis abuse, episodic use 09/03/2016   Attention deficit hyperactivity disorder (ADHD) 09/03/2016   History of posttraumatic stress disorder (PTSD) 09/03/2016    Patient's Medications  New Prescriptions   No medications on file  Previous Medications   CABOTEGRAVIR ER (APRETUDE) 600 MG/3ML INJECTION    Inject 3 mLs (600 mg total) into the muscle every 2 (two) months.  Modified Medications   No medications on file  Discontinued Medications   No medications on file    Allergies: Allergies  Allergen Reactions   Atomoxetine Hcl Other (See Comments)    Psychosis   Peanut Oil Swelling    Swelling of Throat and Hives   Peanut-Containing Drug Products Anaphylaxis   Aripiprazole Other (See Comments)   Atomoxetine Other (See Comments)   Fruit Extracts     Itchy mouth   Coconut (Cocos Nucifera) Rash   Soy Allergy (Obsolete) Rash    Labs: Lab Results  Component Value Date   HIV1RNAQUANT Not Detected 11/18/2023   HIV1RNAQUANT Not Detected 08/06/2023   HIV1RNAQUANT Not Detected 07/09/2023    RPR and STI Lab Results  Component Value Date   LABRPR REACTIVE (A) 11/18/2023   RPRTITER  1:4 (H) 11/18/2023    STI Results GC CT  11/18/2023 10:52 AM Negative  Negative   11/18/2023 10:51 AM Negative  Negative     Hepatitis B Lab Results  Component Value Date   HEPBSAB REACTIVE (A) 07/01/2023   Hepatitis C No results found for: "HEPCAB", "HCVRNAPCRQN" Hepatitis A No results found for: "HAV" Lipids: Lab Results  Component Value Date   CHOL 207 (H) 03/24/2023   TRIG 39 03/24/2023   HDL 83 03/24/2023   CHOLHDL 2.5 03/24/2023   VLDL 8 03/24/2023   LDLCALC 116 (H) 03/24/2023    Current PrEP Regimen: None  TARGET DATE: December 22, 2023  Assessment: Shanan presents today for Apretude restart and to follow up for HIV PrEP. He has had Apretude in the past, but wanted to try oral PrEP to avoid every 2 month blood draws. Adherence has not been well for him on oral PrEP, so he has agreed to restart Apretude. He comes in today following a recent head injury and having some memory loss. He is accompanied by his girlfriend. The patient was re-counseled on Apretude today.  Counseled that Apretude is one intramuscular injection in the gluteal muscle for each visit. Explained that the second injection is 30 days after the initial injection then every 2 months thereafter. Discussed follow up appointments moving forward. Screened for acute HIV symptoms such as fatigue, muscle aches, rash, sore throat, lymphadenopathy, headache, night sweats, nausea/vomiting/diarrhea, and fever. Denies any symptoms. No known exposures  to any STIs since last visit. Declined STI testing today. HIV RNA not detected on 11/18/23. Since it has been 1 week since his last HIV RNA and he has not had any new partners, we will check HIV RNA during the next visit.  Administered cabotegravir 600mg /68mL in left upper outer quadrant of the gluteal muscle. Monitored patient for 10 minutes after injection. Injection was tolerated well without issue. Will make follow up appointments for second initiation injection in 30  days and then maintenance injections every 2 months thereafter.   Plan: - First Apretude injection administered today - Second initiation injection scheduled for 12/17/2023. - Call with any issues or questions  Buddy Duty, PharmD Student [facility] for Infectious Disease

## 2023-12-11 NOTE — Progress Notes (Signed)
 HPI: Edward Griffith is a 33 y.o. male who presents to the [facility] pharmacy clinic for Apretude administration and HIV PrEP follow up.  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   Failure to thrive in newborn 09/15/2016   Failure to thrive (child) 09/15/2016   Family history of alcoholism in father 09/08/2016   Autism spectrum disorder 09/03/2016   Alcohol use disorder, severe, dependence (HCC) 09/03/2016   Stimulant dependence (HCC) 09/03/2016   Cannabis abuse, episodic use 09/03/2016   Attention deficit hyperactivity disorder (ADHD) 09/03/2016   History of posttraumatic stress disorder (PTSD) 09/03/2016    Patient's Medications  New Prescriptions   No medications on file  Previous Medications   CABOTEGRAVIR ER (APRETUDE) 600 MG/3ML INJECTION    Inject 3 mLs (600 mg total) into the muscle every 2 (two) months.  Modified Medications   No medications on file  Discontinued Medications   No medications on file    Allergies: Allergies  Allergen Reactions   Atomoxetine Hcl Other (See Comments)    Psychosis   Peanut Oil Swelling    Swelling of Throat and Hives   Peanut-Containing Drug Products Anaphylaxis   Aripiprazole Other (See Comments)   Atomoxetine Other (See Comments)   Fruit Extracts     Itchy mouth   Coconut (Cocos Nucifera) Rash   Soy Allergy (Obsolete) Rash    Past Medical History: Past Medical History:  Diagnosis Date   ADHD (attention deficit hyperactivity disorder)    Anxiety    Asthma    as a child   Substance abuse (HCC)    Syphilis     Social History: Social History   Socioeconomic History   Marital status: Single    Spouse  name: Not on file   Number of children: Not on file   Years of education: Not on file   Highest education level: Not on file  Occupational History   Not on file  Tobacco Use   Smoking status: Former    Current packs/day: 0.15    Types: Cigarettes   Smokeless tobacco: Never  Vaping Use   Vaping status: Every Day  Substance and Sexual Activity   Alcohol use: Yes    Alcohol/week: 5.0 standard drinks of alcohol    Types: 5 Cans of beer per week   Drug use: Yes    Types: Cocaine    Comment: last use cocaine 3 weeks (as of 05/08/22)   Sexual activity: Not on file  Other Topics Concern   Not on file  Social History Narrative   Not on file   Social Drivers of Health   Financial Resource Strain: Not on file  Food Insecurity: Not on file  Transportation Needs: Not on file  Physical Activity: Not on file  Stress: Not on file  Social Connections: Not on file    Labs: Lab Results  Component Value Date   HIV1RNAQUANT Not Detected 11/18/2023   HIV1RNAQUANT Not Detected 08/06/2023   HIV1RNAQUANT Not Detected 07/09/2023    RPR and STI Lab Results  Component Value Date   LABRPR REACTIVE (A) 11/18/2023   RPRTITER 1:4 (H) 11/18/2023    STI Results GC CT  11/18/2023 10:52 AM Negative  Negative   11/18/2023 10:51 AM Negative  Negative     Hepatitis B Lab Results  Component Value Date   HEPBSAB REACTIVE (A) 07/01/2023   Hepatitis C No results found for: "HEPCAB", "HCVRNAPCRQN" Hepatitis A No results found for: "HAV" Lipids: Lab Results  Component Value Date   CHOL 207 (H) 03/24/2023   TRIG 39 03/24/2023   HDL 83 03/24/2023   CHOLHDL 2.5 03/24/2023   VLDL 8 03/24/2023   LDLCALC 116 (H) 03/24/2023    TARGET DATE: The 18th of the month  Assessment: Edward Griffith presents today for their Apretude injection and to follow up for HIV PrEP. No issues with past injections.  Screened patient for acute HIV symptoms such as fatigue, muscle aches, rash, sore throat,  lymphadenopathy, headache, night sweats, nausea/vomiting/diarrhea, and fever. Patient denies any symptoms.   He states he has no memory of his last appointment including his girlfriend being there and helping schedule future appointments. I asked if he would like to continue the injections or stop PrEP at this time as he is in a monogamous relationship, and he states "she is making me continue the shots". I asked if he would like any opportunity to speak with someone about his relationship and his role in his care; he declined at this time. Also states he is unable to work because of his memory loss and that his girlfriend "is keeping him from working". States he is not able to sleep very well; can sleep ~3-4 hours at most. States he has trialed "all his options" over the counter without relief.   Per [employer] guidelines, a rapid HIV test should be drawn prior to Apretude administration. Due to state shortage of rapid HIV tests, this is temporarily unable to be done. Per decision from [facility] physicians, we will proceed with Apretude administration at this time without a negative rapid HIV test beforehand. HIV RNA was collected today and is in process.  Administered cabotegravir 600mg /69mL in left upper outer quadrant of the gluteal muscle. Will make follow up appointments for maintenance injections every 2 months.   No known exposures to any STIs and no signs or symptoms of any STIs today. Last STI screening was in February and was negative. No new partners since last injection; politely declines STI testing today.   Plan: - Administer Apretude 600 mg x 1  - Maintenance injections scheduled for 5/13 with me  - Check HIV RNA - Call with any issues or questions  Margarite Gouge, PharmD, CPP, BCIDP, AAHIVP Clinical Pharmacist Practitioner Infectious Diseases Clinical Pharmacist [facility]

## 2023-12-16 ENCOUNTER — Other Ambulatory Visit (HOSPITAL_COMMUNITY): Payer: Self-pay

## 2023-12-17 ENCOUNTER — Ambulatory Visit: Payer: Medicare Other | Admitting: Pharmacist

## 2023-12-17 ENCOUNTER — Other Ambulatory Visit: Payer: Self-pay

## 2023-12-17 DIAGNOSIS — Z79899 Other long term (current) drug therapy: Secondary | ICD-10-CM

## 2023-12-17 DIAGNOSIS — Z2981 Encounter for HIV pre-exposure prophylaxis: Secondary | ICD-10-CM | POA: Diagnosis present

## 2023-12-17 DIAGNOSIS — R75 Inconclusive laboratory evidence of human immunodeficiency virus [HIV]: Secondary | ICD-10-CM

## 2023-12-17 MED ORDER — CABOTEGRAVIR ER 600 MG/3ML IM SUER
600.0000 mg | Freq: Once | INTRAMUSCULAR | Status: AC
  Administered 2023-12-17: 600 mg via INTRAMUSCULAR

## 2023-12-19 LAB — HIV-1 RNA QUANT-NO REFLEX-BLD
HIV 1 RNA Quant: NOT DETECTED {copies}/mL
HIV-1 RNA Quant, Log: NOT DETECTED {Log_copies}/mL

## 2024-01-11 ENCOUNTER — Other Ambulatory Visit: Payer: Self-pay

## 2024-01-11 ENCOUNTER — Other Ambulatory Visit (HOSPITAL_COMMUNITY): Payer: Self-pay

## 2024-01-11 NOTE — Progress Notes (Signed)
 Specialty Pharmacy Refill Coordination Note  Edward Griffith is a 33 y.o. male assessed today regarding refills of clinic administered specialty medication(s) Cabotegravir (Apretude)   Clinic requested Courier to Provider Office   Delivery date: 02/02/24   Verified address: [address] [place] [address]   Medication will be filled on 02/01/24.

## 2024-01-18 ENCOUNTER — Other Ambulatory Visit: Payer: Self-pay

## 2024-02-01 ENCOUNTER — Other Ambulatory Visit: Payer: Self-pay

## 2024-02-02 ENCOUNTER — Telehealth: Payer: Self-pay

## 2024-02-02 NOTE — Telephone Encounter (Signed)
 [facility] Patient Advocate Encounter  Patient's medications APRETUDE  have been couriered to [facility] from [facility] and will be administered at the patients appointment on 02/16/24.  Verline Glow, CPhT Specialty Pharmacy Patient [facility] Phone: [phone] Fax:  [phone]

## 2024-02-15 NOTE — Progress Notes (Unsigned)
 HPI: Edward Griffith is a 33 y.o. male who presents to the [facility] pharmacy clinic for Apretude  administration and HIV PrEP follow up.  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   Failure to thrive in newborn 09/15/2016   Failure to thrive (child) 09/15/2016   Family history of alcoholism in father 09/08/2016   Autism spectrum disorder 09/03/2016   Alcohol use disorder, severe, dependence (HCC) 09/03/2016   Stimulant dependence (HCC) 09/03/2016   Cannabis abuse, episodic use 09/03/2016   Attention deficit hyperactivity disorder (ADHD) 09/03/2016   History of posttraumatic stress disorder (PTSD) 09/03/2016    Patient's Medications  New Prescriptions   No medications on file  Previous Medications   CABOTEGRAVIR  ER (APRETUDE ) 600 MG/3ML INJECTION    Inject 3 mLs (600 mg total) into the muscle every 2 (two) months.  Modified Medications   No medications on file  Discontinued Medications   No medications on file    Allergies: Allergies  Allergen Reactions   Atomoxetine Hcl Edward (See Comments)    Psychosis   Peanut Oil Swelling    Swelling of Throat and Hives   Peanut-Containing Drug Products Anaphylaxis   Aripiprazole Edward (See Comments)   Atomoxetine Edward (See Comments)   Fruit Extracts     Itchy mouth   Coconut (Cocos Nucifera) Rash   Soy Allergy (Obsolete) Rash    Labs: Lab Results  Component Value Date   HIV1RNAQUANT NOT DETECTED 12/17/2023   HIV1RNAQUANT Not Detected 11/18/2023   HIV1RNAQUANT Not Detected 08/06/2023    RPR and STI Lab Results  Component Value Date   LABRPR REACTIVE (A) 11/18/2023   RPRTITER  1:4 (H) 11/18/2023    STI Results GC CT  11/18/2023 10:52 AM Negative  Negative   11/18/2023 10:51 AM Negative  Negative     Hepatitis B Lab Results  Component Value Date   HEPBSAB REACTIVE (A) 07/01/2023   Hepatitis C No results found for: "HEPCAB", "HCVRNAPCRQN" Hepatitis A No results found for: "HAV" Lipids: Lab Results  Component Value Date   CHOL 207 (H) 03/24/2023   TRIG 39 03/24/2023   HDL 83 03/24/2023   CHOLHDL 2.5 03/24/2023   VLDL 8 03/24/2023   LDLCALC 116 (H) 03/24/2023    TARGET DATE: 18th of the month  Assessment: Edward Griffith presents today for his Apretude  injection and to follow up for HIV PrEP. No issues with past injections. Denies any symptoms of acute HIV. Last STI screening was in February and was negative. No known exposures to any STIs since last visit. ***Agrees to STI testing today.    Per [employer] guidelines, a rapid HIV test should be drawn prior to Apretude  administration. Due to state shortage of rapid HIV tests, this is temporarily unable to be done. Per decision from [facility] physicians, we will proceed with Apretude  administration at this time without a negative rapid HIV test beforehand. HIV RNA was collected today and is in process.  Administered cabotegravir  600mg /91mL in *** upper outer quadrant of the gluteal muscle. Will see him back in 2 months for injection, labs, and HIV PrEP follow up.  Plan: - Apretude  injection administered -  HIV RNA today - Next injection, labs, and PrEP follow up appointment scheduled for *** - Call with any issues or questions   Valarie Garner, PharmD PGY1 Pharmacy Resident

## 2024-02-16 ENCOUNTER — Ambulatory Visit: Admitting: Pharmacist

## 2024-02-16 DIAGNOSIS — Z79899 Other long term (current) drug therapy: Secondary | ICD-10-CM

## 2024-07-04 ENCOUNTER — Other Ambulatory Visit: Payer: Self-pay

## 2024-07-04 NOTE — Progress Notes (Signed)
 Patient will be not be taking Apretude  due to missing appts.
